# Patient Record
Sex: Female | Born: 1955 | Race: White | Hispanic: No | Marital: Married | Smoking: Former smoker
Health system: Southern US, Community
[De-identification: ages and names within clinical notes are randomized; demographics above are authoritative.]

## PROBLEM LIST (undated history)

## (undated) DIAGNOSIS — T884XXA Failed or difficult intubation, initial encounter: Secondary | ICD-10-CM

## (undated) DIAGNOSIS — N83299 Other ovarian cyst, unspecified side: Secondary | ICD-10-CM

## (undated) DIAGNOSIS — R7303 Prediabetes: Secondary | ICD-10-CM

## (undated) DIAGNOSIS — Z8659 Personal history of other mental and behavioral disorders: Secondary | ICD-10-CM

## (undated) DIAGNOSIS — F411 Generalized anxiety disorder: Secondary | ICD-10-CM

## (undated) DIAGNOSIS — B49 Unspecified mycosis: Secondary | ICD-10-CM

## (undated) DIAGNOSIS — Z789 Other specified health status: Secondary | ICD-10-CM

## (undated) DIAGNOSIS — I341 Nonrheumatic mitral (valve) prolapse: Secondary | ICD-10-CM

## (undated) DIAGNOSIS — D259 Leiomyoma of uterus, unspecified: Secondary | ICD-10-CM

## (undated) DIAGNOSIS — G4733 Obstructive sleep apnea (adult) (pediatric): Secondary | ICD-10-CM

## (undated) DIAGNOSIS — F419 Anxiety disorder, unspecified: Secondary | ICD-10-CM

## (undated) DIAGNOSIS — F329 Major depressive disorder, single episode, unspecified: Secondary | ICD-10-CM

## (undated) DIAGNOSIS — E785 Hyperlipidemia, unspecified: Secondary | ICD-10-CM

## (undated) DIAGNOSIS — I491 Atrial premature depolarization: Secondary | ICD-10-CM

## (undated) DIAGNOSIS — R011 Cardiac murmur, unspecified: Secondary | ICD-10-CM

## (undated) DIAGNOSIS — R002 Palpitations: Secondary | ICD-10-CM

## (undated) DIAGNOSIS — Z973 Presence of spectacles and contact lenses: Secondary | ICD-10-CM

## (undated) DIAGNOSIS — F32A Depression, unspecified: Secondary | ICD-10-CM

## (undated) HISTORY — DX: Depression, unspecified: F32.A

## (undated) HISTORY — DX: Nonrheumatic mitral (valve) prolapse: I34.1

## (undated) HISTORY — DX: Anxiety disorder, unspecified: F41.9

## (undated) HISTORY — DX: Major depressive disorder, single episode, unspecified: F32.9

## (undated) HISTORY — DX: Hyperlipidemia, unspecified: E78.5

---
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
## 2018-03-10 DIAGNOSIS — Z8639 Personal history of other endocrine, nutritional and metabolic disease: Secondary | ICD-10-CM

## 2018-03-10 HISTORY — DX: Personal history of other endocrine, nutritional and metabolic disease: Z86.39

## 2018-04-06 ENCOUNTER — Ambulatory Visit: Payer: BLUE CROSS/BLUE SHIELD | Admitting: Internal Medicine

## 2018-04-06 ENCOUNTER — Encounter: Payer: Self-pay | Admitting: Internal Medicine

## 2018-04-06 VITALS — BP 140/90 | HR 92 | Ht 64.5 in | Wt 125.0 lb

## 2018-04-06 DIAGNOSIS — E0500 Thyrotoxicosis with diffuse goiter without thyrotoxic crisis or storm: Secondary | ICD-10-CM

## 2018-04-06 DIAGNOSIS — R946 Abnormal results of thyroid function studies: Secondary | ICD-10-CM | POA: Diagnosis not present

## 2018-04-06 LAB — T3, FREE: T3 FREE: 3.6 pg/mL (ref 2.3–4.2)

## 2018-04-06 LAB — TSH: TSH: 1.04 u[IU]/mL (ref 0.35–4.50)

## 2018-04-06 LAB — T4, FREE: FREE T4: 1.07 ng/dL (ref 0.60–1.60)

## 2018-04-06 NOTE — Patient Instructions (Addendum)
You have Hashimoto's thyroiditis.  Please start Propranolol ER 60 mg daily at night.  You can start Selenium 200 mcg daily.  Please stop at the lab.  Please look up: Rip Esselstyn - [employer] diet    Please return in 4 months.

## 2018-04-06 NOTE — Progress Notes (Signed)
Patient ID: Robin Reynolds, female   DOB: 1956/06/30, 62 y.o.   MRN: [id number]   HPI  Robin Reynolds is a 62 y.o.-year-old female, referred by her cardiologist, Dr.Ganji, for management of elevated free T4, suspicious for Graves ds. She lives 2h away.  Patient describes that at the time of her annual physical exam in 10/2017, she was found to have an irregular heart rhythm and was recommended to wear a Holter monitor.  This revealed frequent PVCs and 2 runs of atrial tachycardia per review of Dr. Irven Shelling notes.  She also has panic attacks,  palpitations and atypical chest pain.  Per Dr. Einar Gip, the chest pain appears to be musculoskeletal.  She also has MVP.  Investigation revealed a normal TSH, of 1.18, but slightly high free T4, of 1.35 (0.81-1.12). However. At that time, she was on Biotin.  Thyroid scan (02/26/2018) showed: Increased homogeneous radiotracer uptake throughout each normal-sized thyroid lobe.  Markedly increased uptake relative to activity in salivary glands.  This was read as consistent with Graves' disease.  She was started on a low-dose beta-blocker (Inderal ER 60 mg daily at night) on 07/30/2017, since she usually has a low blood pressure.  She saw an endocrinologist 2 weeks ago >> new labs normal except elevated anti-thyroglobulin antibodies.   I reviewed pt's thyroid tests - per records from Dr. Einar Gip as well as brought by pt: 03/22/2018: TSH 0.981, fT4 1.2, fT3 3.1, TSI <89, TPO 5, ATA 2  (<1) 02/12/2018: TSH 1.18, fT4 1.35 (0.81-1.12) 06/08/2017: TSH 1.290, rT3 17.8 (9.2-24.1), fT4 1.25, fT3 2.9, Se 130 (91-198) No results found for: TSH, FREET4, T3FREE  Antithyroid antibodies: 03/22/2018: TSI <89 No results found for: TSI  Pt denies feeling nodules in neck, but has hoarseness, dysphagia, no SOB with lying down.  She c/o: - palpitations - anxiety - unintentional wt loss-  30 lbs over < a year - tremors - irritability - anxiety - insomnia - night sweats -  fatigue - HAs - brain fog - dry skin - thinning hair at temples  Pt does have a FH of thyroid ds.: father (? Problem), nephew (Hashimoto's ds). No FH of thyroid cancer. No h/o radiation tx to head or neck.  No seaweed or kelp, no recent contrast studies. No steroid use. No herbal supplements. + Biotin use - last dose 3 days ago..  ROS: Constitutional: + see HPI Eyes: no blurry vision, no xerophthalmia ENT: no sore throat, + see HOI+ tinnitus Cardiovascular: no CP/SOB/palpitations/leg swelling Respiratory: +cough/+ SOB Gastrointestinal: + N/no V/D/C Musculoskeletal: no muscle/joint aches Skin: no rashes, + hair thinning/loss Neurological: + tremors/no numbness/tingling/dizziness Psychiatric: + both  depression/anxiety  Past Medical History:  Diagnosis Date  . Anxiety   . Depression   . Hyperlipidemia   . Mitral valve prolapse    History reviewed. No pertinent surgical history. Social History   Socioeconomic History  . Marital status: married    Spouse name: Not on file  . Number of children: 0  Occupational History  . retired  Tobacco Use  . Smoking status: Former Research scientist (life sciences)  . Smokeless tobacco: Never Used  Substance and Sexual Activity  . Alcohol use: Yes, wine, rarely  . Drug use: Never   Current Outpatient Medications on File Prior to Visit  Medication Sig Dispense Refill  . Ascorbic Acid (VITAMIN C) 1000 MG tablet Take 1,000 mg by mouth daily.    . Calcium Carbonate-Vitamin D 300-100 MG-UNIT CAPS Take by mouth.    . Omega-3 Fatty Acids (FISH  OIL CONCENTRATE PO) Take by mouth.    . Probiotic Product (PROBIOTIC DAILY PO) Take by mouth.    . Pyridoxine HCl (VITAMIN B6 PO) Take by mouth.    . sertraline (ZOLOFT) 100 MG tablet     . traZODone (DESYREL) 150 MG tablet Take by mouth.    . norethindrone (MICRONOR,CAMILA,ERRIN) 0.35 MG tablet     . propranolol ER (INDERAL LA) 60 MG 24 hr capsule      No current facility-administered medications on file prior to visit.     Allergies  Allergen Reactions  . Ciprofloxacin    Family History  Problem Relation Age of Onset  . Cancer Mother 31       Stomach  . Thyroid disease Father   . Diabetes Neg Hx   . Hypertension Neg Hx     PE: BP 140/90 (BP Location: Left Arm, Patient Position: Sitting, Cuff Size: Normal)   Pulse 92   Ht 5' 4.5" (1.638 m)   Wt 125 lb (56.7 kg)   SpO2 97%   BMI 21.12 kg/m  Wt Readings from Last 3 Encounters:  04/06/18 125 lb (56.7 kg)   Constitutional: Normal weight, in NAD Eyes: PERRLA, EOMI, no exophthalmos, no lid lag, no stare ENT: moist mucous membranes, no thyromegaly, no thyroid bruits, no cervical lymphadenopathy Cardiovascular: tachycardia, RR, No MRG Respiratory: CTA B Gastrointestinal: abdomen soft, NT, ND, BS+ Musculoskeletal: no deformities, strength intact in all 4 Skin: moist, warm, no rashes Neurological: no tremor with outstretched hands, DTR normal in all 4  ASSESSMENT: 1. Elevated free T4  PLAN:  1. Patient with a recently found elevated free T4 with normal TSH, with thyrotoxic sxs: weight loss,  palpitations/tachycardia, anxiety/depression, insomnia, irritability, tremors. Patient describes that the symptoms developed over ~1 year.  At the end of last year, she had investigation for her tachycardia and was found to have multiple PVCs on Holter monitor.  TSH levels have been normal, however, she had one elevated free T4 on the labs checked in 02/2018.  At that time, she was prescribed propranolol but she did not start it yet. - At this visit we reviewed her tests from last summer, the ones from 02/2018, and also most recent labs obtained by another endocrinologist approximately 2 weeks ago.  The thyroid function tests are normal except for one slightly elevated free T4.  She had a thyroid scan that showed diffuse distribution of the tracer, and no evident nodules.  In the absence of an uptake, it is difficult to diagnose this as Graves' disease.  To be able  to diagnose this condition, we would need an elevated uptake (higher than 30%), and a homogeneous scan.  Moreover, her TSI antibodies are not elevated, while her ATA antibodies are slightly elevated.  In this situation, I explained that her most likely diagnosis is Hashimoto's thyroiditis (with probably both inhibiting and stimulating antibodies).  It is of note that she is taking biotin and may have taken this right before her labs from 02/2018 - this is known to suppress the TSH and elevate free T4 and free T3 due to an interference with the assay.  I cannot rule out at this point such etiology of her elevated free T4.  If her symptoms do not improve, I would like to obtain a thyroid uptake and scan in the near future. - However, we discussed at length today about autoimmune thyroid diseases, including Graves' disease and Hashimoto's thyroiditis.  We discussed that we do not have curative  treatments for the latter (short of thyroidectomy), but I suggested several methods for improving her immune system so that her symptoms improve: Rest, good sleep, exercise, meditation (which she already started to do), take time during the day for herself, good diet (we discussed about an anti-inflammatory diet and suggested references). Also, selenium supplementation has been found to help in some cases by decreasing antithyroid antibodies, so I suggested to start 200 mcg daily. - I think anxiety plays a huge role in her symptoms.  She is on Zoloft and I feel starting propranolol will greatly help.  I strongly advised her to do so and to take it at night since it can promote sleep. - Today we will check her TFTs again (last biotin dose 2 to 3 days ago) - I advised her to join my chart to communicate easier - RTC in 4 months, but likely sooner for repeat labs  Component     Latest Ref Rng & Units 04/06/2018  T4,Free(Direct)     0.60 - 1.60 ng/dL 1.07  Triiodothyronine,Free,Serum     2.3 - 4.2 pg/mL 3.6  TSH     0.35  - 4.50 uIU/mL 1.04   TFTs normal.   We will continue with the plan above and I will recheck her TFTs when she comes back in 4 months.  Philemon Kingdom, MD PhD [facility] Endocrinology

## 2018-04-08 ENCOUNTER — Encounter: Payer: Self-pay | Admitting: Internal Medicine

## 2018-04-20 ENCOUNTER — Other Ambulatory Visit: Payer: Self-pay | Admitting: Internal Medicine

## 2018-04-20 ENCOUNTER — Encounter: Payer: Self-pay | Admitting: Internal Medicine

## 2018-04-20 DIAGNOSIS — R635 Abnormal weight gain: Secondary | ICD-10-CM

## 2018-04-30 ENCOUNTER — Encounter: Payer: Self-pay | Admitting: Internal Medicine

## 2018-05-20 ENCOUNTER — Ambulatory Visit: Payer: BLUE CROSS/BLUE SHIELD | Admitting: Dietician

## 2018-05-21 LAB — CBC AND DIFFERENTIAL
HEMATOCRIT: 42 (ref 36–46)
Hemoglobin: 14.4 (ref 12.0–16.0)
PLATELETS: 249 (ref 150–399)
WBC: 8.5

## 2018-05-21 LAB — HEPATIC FUNCTION PANEL
ALT: 17 (ref 7–35)
AST: 16 (ref 13–35)

## 2018-05-21 LAB — VITAMIN D 25 HYDROXY (VIT D DEFICIENCY, FRACTURES): VIT D 25 HYDROXY: 38.9

## 2018-05-21 LAB — HEMOGLOBIN A1C: HEMOGLOBIN A1C: 5.5

## 2018-05-21 LAB — VITAMIN B12: Vitamin B-12: 508

## 2018-08-05 ENCOUNTER — Encounter: Payer: Self-pay | Admitting: Internal Medicine

## 2018-08-05 ENCOUNTER — Ambulatory Visit: Payer: BLUE CROSS/BLUE SHIELD | Admitting: Internal Medicine

## 2018-08-05 VITALS — BP 138/80 | HR 109 | Ht 64.5 in | Wt 118.0 lb

## 2018-08-05 DIAGNOSIS — R946 Abnormal results of thyroid function studies: Secondary | ICD-10-CM

## 2018-08-05 NOTE — Patient Instructions (Signed)
Please return to see me as needed. °

## 2018-08-05 NOTE — Progress Notes (Signed)
Patient ID: Robin Reynolds, female   DOB: 1956-01-25, 62 y.o.   MRN: [id number]   HPI  Robin Reynolds is a 63 y.o.-year-old female, initially referred by her cardiologist, Dr.Ganji, for management of elevated free T4, suspicious for Graves ds.  She lives 2 [place] away.  Last visit 4 months ago.  She has increased anxiety >> will see Dr. Edilia Bo soon. She increased her Zoloft - no relief. She changed to Cymbalta.  She recently started to see an integrative medicine provider who diagnosed her with intestinal candidiasis.  She is undergoing treatment now.  Reviewed and addended history: Patient describes that at the time of her annual physical exam in 10/2017, she was found to have an irregular heart rhythm and was recommended to wear a Holter monitor.  This revealed frequent PVCs and 2 runs of atrial tachycardia per review of Dr. Irven Shelling notes.  She also has panic attacks,  palpitations and atypical chest pain.  Per Dr. Einar Gip, the chest pain appears to be musculoskeletal.  She also has MVP.  Investigation revealed a normal TSH, of 1.18, but slightly high free T4, of 1.35 (0.81-1.12).  However, at that time, she thinks she may have been on biotin.  She saw an endocrinologist before our last visit.  New labs showed elevated thyroid antibodies.  Reviewed her previous: Thyroid scan (02/26/2018) : Increased homogeneous radiotracer uptake throughout each normal-sized thyroid lobe.  Markedly increased uptake relative to activity in salivary glands.  This was read as consistent with Graves' disease.  I reviewed patient's TFTs, per records from Dr. Einar Gip and from last visit: 06/28/2018: TSH 1.40, fT4 0.95 Lab Results  Component Value Date   TSH 1.04 04/06/2018   FREET4 1.07 04/06/2018   T3FREE 3.6 04/06/2018  03/22/2018: TSH 0.981, fT4 1.2, fT3 3.1, TSI <89, TPO 5, ATA 2  (<1) 02/12/2018: TSH 1.18, fT4 1.35 (0.81-1.12) 06/08/2017: TSH 1.290, rT3 17.8 (9.2-24.1), fT4 1.25, fT3 2.9, Se 130  (91-198)  Antithyroid antibodies: 03/22/2018: TSI <89 No results found for: TSI  Pt denies: - feeling nodules in neck - dysphagia - choking - SOB with lying down But does have hoarseness  She still complains of: - anxiety associated with chest pain - Brain fog - fatigue - unintentional wt loss-  30 lbs over < a year, 7 pounds since last visit - hair loss  Pt does have a FH of thyroid ds.: father (? Problem), nephew (Hashimoto's ds). No FH of thyroid cancer. No h/o radiation tx to head or neck.  No seaweed or kelp. No recent contrast studies. No herbal supplements. No Biotin use. No recent steroids use.   She started Prometrium >> sleeps better.   ROS: Constitutional: + See HPI Eyes: no blurry vision, no xerophthalmia ENT: no sore throat, + see HPI Cardiovascular: no CP/no SOB/+ palpitations/no leg swelling Respiratory: no cough/no SOB/no wheezing Gastrointestinal: no N/no V/no D/no C/no acid reflux Musculoskeletal: no muscle aches/no joint aches Skin: no rashes, no hair loss Neurological: no tremors/no numbness/no tingling/no dizziness  I reviewed pt's medications, allergies, PMH, social hx, family hx, and changes were documented in the history of present illness. Otherwise, unchanged from my initial visit note.  Past Medical History:  Diagnosis Date  . Anxiety   . Depression   . Hyperlipidemia   . Mitral valve prolapse    No past surgical history on file. Social History   Socioeconomic History  . Marital status: married    Spouse name: Not on file  . Number of children:  0  Occupational History  . retired  Tobacco Use  . Smoking status: Former Research scientist (life sciences)  . Smokeless tobacco: Never Used  Substance and Sexual Activity  . Alcohol use: Yes, wine, rarely  . Drug use: Never   Current Outpatient Medications on File Prior to Visit  Medication Sig Dispense Refill  . Ascorbic Acid (VITAMIN C) 1000 MG tablet Take 1,000 mg by mouth daily.    . Calcium  Carbonate-Vitamin D 300-100 MG-UNIT CAPS Take by mouth.    . Omega-3 Fatty Acids (FISH OIL CONCENTRATE PO) Take by mouth.    . Probiotic Product (PROBIOTIC DAILY PO) Take by mouth.    . propranolol ER (INDERAL LA) 60 MG 24 hr capsule     . Pyridoxine HCl (VITAMIN B6 PO) Take by mouth.    . sertraline (ZOLOFT) 100 MG tablet     . traZODone (DESYREL) 150 MG tablet Take by mouth.     No current facility-administered medications on file prior to visit.    Allergies  Allergen Reactions  . Ciprofloxacin    Family History  Problem Relation Age of Onset  . Cancer Mother 8       Stomach  . Thyroid disease Father   . Diabetes Neg Hx   . Hypertension Neg Hx     PE: BP 138/80   Pulse (!) 109   Ht 5' 4.5" (1.638 m) Comment: measured  Wt 118 lb (53.5 kg)   SpO2 98%   BMI 19.94 kg/m  Wt Readings from Last 3 Encounters:  08/05/18 118 lb (53.5 kg)  04/06/18 125 lb (56.7 kg)   Constitutional: Normal weight, in NAD Eyes: PERRLA, EOMI, no exophthalmos ENT: moist mucous membranes, no thyromegaly, no cervical lymphadenopathy Cardiovascular: Tachycardia, RR, No MRG Respiratory: CTA B Gastrointestinal: abdomen soft, NT, ND, BS+ Musculoskeletal: no deformities, strength intact in all 4 Skin: moist, warm, no rashes Neurological: no tremor with outstretched hands, DTR normal in all 4  ASSESSMENT: 1. Elevated free T4  PLAN:  1. Patient history of an elevated free T4, with normal TSH but with thyrotoxic symptoms: Weight loss, palpitations/tachycardia, anxiety/depression, insomnia, irritability, tremors.  The symptoms developed over approximately 1 year.  At the end of last year, she had investigation for her tachycardia and was found to have multiple PVCs on Holter monitor.  TSH levels have been normal, however, she had one elevated free T4 on the labs checked in 02/2018.  Of note, she may have taken biotin prior to the labs from 02/2018 and this is known to suppressed TSH and elevated free T4  and free T3 due to interference with the assay.  Since then, she is only taking biotin and a multivitamin.  She brought this today with her and this contains 400 mcg of biotin.  -Of note, her TSI antibodies were not elevated to point towards Graves' disease.  Her ATA antibodies were slightly elevated.  She also had a thyroid scan that showed diffuse distribution of the tracer and no evident nodules.  An uptake was not performed at that time, so a diagnosis of Graves' cannot be made... -At last visit, we checked her TFTs and they were all normal.  At that time, we discussed that she most likely has Hashimoto's thyroiditis (with probably both inhibiting and stimulating antibodies).  Since then, she had another set of labs performed by PCP and these were all normal (see HPI) -At last visit, we discussed at length about autoimmune thyroid diseases, including Graves' disease and Hashimoto's thyroiditis.  We discussed about methods to improve her immune system.  She is now on a diet for her intestinal candidiasis.  She is upset that she lost more weight and we discussed about including more calorie dense foods in her diet. -She continues to have anxiety, however, with normal thyroid tests, we cannot attribute this to the thyroid.   -I will see her back on an as-needed basis.  I did recommend that she has thyroid tests: TSH, free T4, free T3 checked once a year from now on and return to see me if this become abnormal.  - time spent with the patient: 25 min, of which >50% was spent in obtaining information about her symptoms, reviewing her previous labs, evaluations, and treatments, counseling her about her condition (please see the discussed topics above), and developing a plan to further investigate and treat it; she had a number of questions which I addressed.   Philemon Kingdom, MD PhD [facility] Endocrinology
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
## 2018-08-16 ENCOUNTER — Ambulatory Visit: Payer: BLUE CROSS/BLUE SHIELD | Admitting: Family Medicine

## 2018-08-16 ENCOUNTER — Encounter: Payer: Self-pay | Admitting: Family Medicine

## 2018-08-16 ENCOUNTER — Encounter

## 2018-08-16 VITALS — BP 130/80 | HR 82 | Temp 97.8°F | Resp 16 | Ht 64.5 in | Wt 119.0 lb

## 2018-08-16 DIAGNOSIS — R002 Palpitations: Secondary | ICD-10-CM

## 2018-08-16 DIAGNOSIS — F418 Other specified anxiety disorders: Secondary | ICD-10-CM | POA: Diagnosis not present

## 2018-08-16 DIAGNOSIS — I499 Cardiac arrhythmia, unspecified: Secondary | ICD-10-CM

## 2018-08-16 DIAGNOSIS — F339 Major depressive disorder, recurrent, unspecified: Secondary | ICD-10-CM

## 2018-08-16 DIAGNOSIS — R634 Abnormal weight loss: Secondary | ICD-10-CM | POA: Diagnosis not present

## 2018-08-16 LAB — COMPREHENSIVE METABOLIC PANEL
ALT: 13 U/L (ref 0–35)
AST: 15 U/L (ref 0–37)
Albumin: 4.3 g/dL (ref 3.5–5.2)
Alkaline Phosphatase: 70 U/L (ref 39–117)
BUN: 17 mg/dL (ref 6–23)
CHLORIDE: 103 meq/L (ref 96–112)
CO2: 29 meq/L (ref 19–32)
Calcium: 9.1 mg/dL (ref 8.4–10.5)
Creatinine, Ser: 0.91 mg/dL (ref 0.40–1.20)
GFR: 66.29 mL/min (ref >60.00)
Glucose, Bld: 82 mg/dL (ref 70–99)
POTASSIUM: 3.6 meq/L (ref 3.5–5.1)
Sodium: 138 mEq/L (ref 135–145)
Total Bilirubin: 0.6 mg/dL (ref 0.2–1.2)
Total Protein: 6.6 g/dL (ref 6.0–8.3)

## 2018-08-16 MED ORDER — PROPRANOLOL HCL 10 MG PO TABS: ORAL_TABLET | ORAL | 2 refills | Status: DC

## 2018-08-16 NOTE — Patient Instructions (Signed)
It was nice to meet you today We are going to try and find anything that is causing you to lose weight Labs today We are ordering a Ct scan of your chest, abdomen and pelvis You will see GI and likely have a colonoscopy soon If the increased cymbalta is not helping you we might change to paxil- keep me posted  [facility] may be a good fit for you- please give them a call

## 2018-08-17 ENCOUNTER — Encounter: Payer: Self-pay | Admitting: Family Medicine

## 2018-08-20 ENCOUNTER — Encounter: Payer: Self-pay | Admitting: Family Medicine

## 2018-08-20 DIAGNOSIS — R634 Abnormal weight loss: Secondary | ICD-10-CM

## 2018-08-24 ENCOUNTER — Encounter: Payer: Self-pay | Admitting: Family Medicine

## 2018-08-24 LAB — FOLATE: FOLATE: 12.5

## 2018-08-25 ENCOUNTER — Ambulatory Visit (HOSPITAL_BASED_OUTPATIENT_CLINIC_OR_DEPARTMENT_OTHER)
Admission: RE | Admit: 2018-08-25 | Discharge: 2018-08-25 | Disposition: A | Discharge status: Home / Self Care | Payer: BLUE CROSS/BLUE SHIELD | Source: Ambulatory Visit | Attending: Family Medicine | Admitting: Family Medicine

## 2018-08-25 DIAGNOSIS — R634 Abnormal weight loss: Secondary | ICD-10-CM

## 2018-08-26 ENCOUNTER — Telehealth: Payer: Self-pay | Admitting: *Deleted

## 2018-08-26 NOTE — Telephone Encounter (Signed)
Received Medical records from [facility]; forwarded to provider/SLS 10/17

## 2018-08-31 ENCOUNTER — Encounter: Payer: Self-pay | Admitting: Family Medicine

## 2018-08-31 ENCOUNTER — Telehealth: Payer: Self-pay | Admitting: *Deleted

## 2018-08-31 NOTE — Telephone Encounter (Signed)
Received Denied Imaging request for patient, per [facility] [P[facility]], provider is aware. Peer to peer is needed post required Xray that has been completed; forwarded to provider/SLS 10/22

## 2018-09-06 ENCOUNTER — Encounter (HOSPITAL_BASED_OUTPATIENT_CLINIC_OR_DEPARTMENT_OTHER): Payer: Self-pay

## 2018-09-06 ENCOUNTER — Ambulatory Visit (HOSPITAL_BASED_OUTPATIENT_CLINIC_OR_DEPARTMENT_OTHER)
Admission: RE | Admit: 2018-09-06 | Discharge: 2018-09-06 | Disposition: A | Discharge status: Home / Self Care | Payer: BLUE CROSS/BLUE SHIELD | Source: Ambulatory Visit | Attending: Family Medicine | Admitting: Family Medicine

## 2018-09-06 DIAGNOSIS — D259 Leiomyoma of uterus, unspecified: Secondary | ICD-10-CM | POA: Diagnosis not present

## 2018-09-06 DIAGNOSIS — R634 Abnormal weight loss: Secondary | ICD-10-CM | POA: Diagnosis present

## 2018-09-06 DIAGNOSIS — K5790 Diverticulosis of intestine, part unspecified, without perforation or abscess without bleeding: Secondary | ICD-10-CM | POA: Insufficient documentation

## 2018-09-06 DIAGNOSIS — I2510 Atherosclerotic heart disease of native coronary artery without angina pectoris: Secondary | ICD-10-CM | POA: Insufficient documentation

## 2018-09-06 MED ORDER — IOPAMIDOL (ISOVUE-300) INJECTION 61%
100.0000 mL | Freq: Once | INTRAVENOUS | Status: AC | PRN
  Administered 2018-09-06: 100 mL via INTRAVENOUS

## 2018-09-07 ENCOUNTER — Encounter: Payer: Self-pay | Admitting: Family Medicine

## 2018-09-07 DIAGNOSIS — N83209 Unspecified ovarian cyst, unspecified side: Secondary | ICD-10-CM

## 2018-09-21 NOTE — Progress Notes (Signed)
[facility] at [facility] [address], [place], [place] [address] [phone]  Date:  09/23/2018   Name:  Robin Reynolds   DOB:  03-06-1956   MRN:  [id number]  PCP:  Darreld Mclean, MD    Chief Complaint: Anxiety (cymbalta follow up, would like to possibly increase, feeling a little better-but still having trouble functioning) and Neck Stiffness (feels stiff, feels like it is on "steroids", back pain)   History of Present Illness:  Robin Reynolds is a 62 y.o. very pleasant female patient who presents with the following:  Here today to discuss anxiety History of depression and anxiety Also history of abnormal thyroid test but most recently ok  Last seen by myself about a month ago as a new patient.  See very lengthly note dated 08/16/18 She has been concerned about weight loss so we had touched on the idea of changing her cymbalta over the paxil in hopes of helping her gain weight  NCCSR:  07/08/2018  1   05/21/2018  Alprazolam 0.5 Mg Tablet  90.00 30 Ro Laz  [id number]  Woo ([id number])  1/1 3.00 LME Comm Ins  [facility]  05/21/2018  1   05/21/2018  Alprazolam 0.5 Mg Tablet  90.00 30 Ro Laz  [id number]  Woo ([id number])  0/1 3.00 [place] Comm Ins  [facility]  05/07/2018  1   05/07/2018  Alprazolam 0.25 Mg Tablet  30.00 10 Ro Laz  [id number]  Woo ([id number])  0/0 1.50 LME Comm Ins  [facility]  02/11/2018  1   02/11/2018  Lorazepam 0.5 Mg Tablet  60.00 20 Ro Laz  [id number]  [facility]oo ([id number])  0/5 1.50 [place]ME Comm Ins  [facility]  02/08/2018  1   01/08/2018  Lorazepam 0.5 Mg Tablet  30.00 30 Ro Laz  [id number]  [facility]oo ([id number])  1/1 0.50 LME Private Pay  [facility]  01/08/2018  1   01/08/2018  Lorazepam 0.5 Mg Tablet  30.00 30 Ro Laz  [id number]  Carlena Bjornstad [phone])  0/1      She is now taking 60mg  of cymbalta- once a day She does feel like this is helping her.  She would like to try going up to 90 mg which is fine We tried increasing her propranolol but it "made me depressed within a couple of hours of taking it, I sat and sobbdfor  hours, and it increased my heart rate" She stopped taking propranolol which is fine  She also wonders about "bioidential hormones" that she is getting from her integrative doctor  Advised her that decision about using this is up to her and her other provoider They have her taking progesterone and it does seem to be helping her with sleep  She notes that this am she is feeling quite well, but this is not typical for her She does have some anxiety still which centers around her health Admits that she spends a lot of time doing online reading and research about her health which may cause her to feel more anxious   She did see GI- they want to do a colonoscopy and endoscopy  They want to test her for celiac during her procedure which seems like a good idea  She has tried cutting gluten for the last 2-3 weeks and feels that she is doing better   She did get a psychiatrist and has an appt scheduled for December  She also notes that her neck "feels thick like i've been taking  steroids" and that her belly button has turned from an "innie to an outie"  BP Readings from Last 3 Encounters:  09/23/18 118/78  08/16/18 130/80  08/05/18 138/80   Wt Readings from Last 3 Encounters:  09/23/18 125 lb (56.7 kg)  08/16/18 119 lb (54 kg)  08/05/18 118 lb (53.5 kg)    Lab Results  Component Value Date   TSH 1.04 04/06/2018     Patient Active Problem List   Diagnosis Date Noted  . Abnormal thyroid function test 04/06/2018    Past Medical History:  Diagnosis Date  . Anxiety   . Depression   . Hyperlipidemia   . Mitral valve prolapse     History reviewed. No pertinent surgical history.  Social History   Tobacco Use  . Smoking status: Former Research scientist (life sciences)  . Smokeless tobacco: Never Used  Substance Use Topics  . Alcohol use: Not Currently  . Drug use: Never    Family History  Problem Relation Age of Onset  . Cancer Mother 16       Stomach  . Thyroid disease Father   . Diabetes Neg Hx    . Hypertension Neg Hx     Allergies  Allergen Reactions  . Inderal [Propranolol]     palpitations  . Tussionex Pennkinetic Er [Hydrocod Polst-Cpm Polst Er]   . Ciprofloxacin     Medication list has been reviewed and updated.  Current Outpatient Medications on File Prior to Visit  Medication Sig Dispense Refill  . ALPRAZolam (XANAX) 0.5 MG tablet Take by mouth.    . Calcium Carbonate-Vitamin D 300-100 MG-UNIT CAPS Take by mouth.    . Omega-3 Fatty Acids (FISH OIL CONCENTRATE PO) Take by mouth.    . Probiotic Product (PROBIOTIC DAILY PO) Take by mouth.    . Pyridoxine HCl (VITAMIN B6 PO) Take by mouth.    . traZODone (DESYREL) 150 MG tablet Take by mouth.    Marland Kitchen UNABLE TO FIND Med Name: Bio Identical Progesterone Hormone for sleep     No current facility-administered medications on file prior to visit.     Review of Systems:  As per HPI- otherwise negative. No fever or chills No SI  Continues to have significant anxiety She has gained a few lbs!  Good news  Physical Examination: Vitals:   09/23/18 1058  BP: 118/78  Pulse: 63  Resp: 16  SpO2: 100%   Vitals:   09/23/18 1058  Weight: 125 lb (56.7 kg)  Height: 5' 4.5" (1.638 m)   Body mass index is 21.12 kg/m. Ideal Body Weight: Weight in (lb) to have BMI = 25: 147.6  GEN: WDWN, NAD, Non-toxic, A & O x 3, looks well, slim build  HEENT: Atraumatic, Normocephalic. Neck supple. No masses, No LAD.  Bilateral TM wnl, oropharynx normal.  PEERL,EOMI.   Neck exam is quite normal  Ears and Nose: No external deformity. CV: RRR, No M/G/R. No JVD. No thrill. No extra heart sounds. PULM: CTA B, no wheezes, crackles, rhonchi. No retractions. No resp. distress. No accessory muscle use. ABD: S, NT, ND. No rebound. No HSM. EXTR: No c/c/e NEURO Normal gait.  PSYCH: Normally interactive. Conversant. Not depressed or anxious appearing.  Calm demeanor.  She seems to have developed a skin tag in her umbilicus. Reassured that this  does not seem to be anything of concern   Assessment and Plan: Depression, recurrent ([place]) - Plan: DULoxetine (CYMBALTA) 30 MG capsule  Abnormal weight loss  Anxiety about health  She  wishes to find a GYN doctor and I have provided a referral for her We are going to try increasing her cymbalta to 90mg  per day She is seeing psychiatry soon which is great Declines a flu shot today Plan to visit in 2-3 months   Signed Lamar Blinks, MD

## 2018-09-23 ENCOUNTER — Telehealth: Payer: Self-pay

## 2018-09-23 ENCOUNTER — Ambulatory Visit: Payer: BLUE CROSS/BLUE SHIELD | Admitting: Family Medicine

## 2018-09-23 ENCOUNTER — Encounter: Payer: Self-pay | Admitting: Family Medicine

## 2018-09-23 VITALS — BP 118/78 | HR 63 | Resp 16 | Ht 64.5 in | Wt 125.0 lb

## 2018-09-23 DIAGNOSIS — F339 Major depressive disorder, recurrent, unspecified: Secondary | ICD-10-CM

## 2018-09-23 DIAGNOSIS — R634 Abnormal weight loss: Secondary | ICD-10-CM

## 2018-09-23 DIAGNOSIS — F418 Other specified anxiety disorders: Secondary | ICD-10-CM

## 2018-09-23 MED ORDER — DULOXETINE HCL 30 MG PO CPEP: 90.0000 mg | ORAL_CAPSULE | Freq: Every day | ORAL | 3 refills | Status: DC

## 2018-09-23 NOTE — Telephone Encounter (Signed)
PA initiated via Covermymeds; KEY: [id number]. Awaiting determination.

## 2018-09-23 NOTE — Patient Instructions (Addendum)
[facility]   Let's try increasing your cymbalta to 90 mg total a day- perhaps 30 am and 60pm, or vice versa Let me know what your colonoscopy shows!   Let's visit in 2-3 months to check on how you are doing

## 2018-09-23 NOTE — Telephone Encounter (Signed)
PA approved. Effective 09/23/2018 to 09/23/2019.

## 2018-10-04 ENCOUNTER — Encounter: Payer: Self-pay | Admitting: Family Medicine

## 2018-10-04 DIAGNOSIS — N83209 Unspecified ovarian cyst, unspecified side: Secondary | ICD-10-CM

## 2018-10-10 HISTORY — PX: COLONOSCOPY WITH PROPOFOL: SHX5780

## 2018-10-12 ENCOUNTER — Encounter: Payer: Self-pay | Admitting: Family Medicine

## 2018-10-12 ENCOUNTER — Telehealth: Payer: Self-pay

## 2018-10-12 NOTE — Telephone Encounter (Signed)
Copied from [place] [id number]. Topic: General - Other >> Oct 12, 2018 10:42 AM Yvette Rack wrote: Reason for CRM: pt calling sttaing that she has her appt with [facility], DR. Fogleman on Jan 2nd but they don't have her CT scans that was done on 09-07-18 pt and [facility] would like for provider to change the purpose of referral to cyst and anxiety the referral has neck and shoulder pain which doesn't go to a OBGYN office pt would like a call back when its done at [phone]

## 2018-10-12 NOTE — Telephone Encounter (Signed)
Spoke w/ Pt- informed of below. Pt verbalized understanding.  

## 2018-10-12 NOTE — Telephone Encounter (Signed)
I have called [facility] The reason for the referral is already "ovarian cyst" so all ok here.  The MD is able to pull up and view her CT scans done in October.  All is ready per my understanding Please call her and let her know Thank you! [place]

## 2018-10-14 ENCOUNTER — Ambulatory Visit: Payer: BLUE CROSS/BLUE SHIELD | Admitting: Dietician

## 2018-10-18 ENCOUNTER — Encounter: Payer: Self-pay | Admitting: Internal Medicine

## 2018-10-25 ENCOUNTER — Encounter: Payer: Self-pay | Admitting: Dietician

## 2018-10-25 ENCOUNTER — Encounter: Payer: BLUE CROSS/BLUE SHIELD | Attending: Internal Medicine | Admitting: Dietician

## 2018-10-25 DIAGNOSIS — Z6820 Body mass index (BMI) 20.0-20.9, adult: Secondary | ICD-10-CM | POA: Insufficient documentation

## 2018-10-25 DIAGNOSIS — R634 Abnormal weight loss: Secondary | ICD-10-CM | POA: Insufficient documentation

## 2018-10-25 DIAGNOSIS — Z91018 Allergy to other foods: Secondary | ICD-10-CM

## 2018-10-25 DIAGNOSIS — Z713 Dietary counseling and surveillance: Secondary | ICD-10-CM | POA: Diagnosis present

## 2018-10-25 DIAGNOSIS — E569 Vitamin deficiency, unspecified: Secondary | ICD-10-CM

## 2018-10-25 NOTE — Patient Instructions (Signed)
Breakfast, Lunch, Dinner daily Avoid skipping meals Snack if you are hungry Protein shake for a snack if you wish Be careful not to over restrict your diet. Increasing your intake of vegetables and plant foods is very healthy.

## 2018-10-25 NOTE — Progress Notes (Signed)
Medical Nutrition Therapy:  Appt start time: 7858 end time:  8502.   Assessment:  Primary concerns today: .  Patient is here today alone as she is concerned about her weight loss.  She does not know why she is losing weight.  She states that she is having increased depression and anxiety.  She would like to regain to 130 lbs.  History includes MVP and prediabetes in the past but resolved with weight loss.  Weight hx: 122 lbs today (BMI 20) 118 lbs September 2019 Felt that she started to gain weight when she is not eating gluten. 153 lbs highest adult weight. 118 lbs lowest adult weight Weight loss started about 15 months ago.  She started on a plant based diet for a couple of weeks and lost a small amount of weight. She lost weight rather rapidly.  She lost increased weight, muscle, and became weak.  She developed increased anxiety and depression.  Medication is not helping this.  She admitted herself to a psychiatric hospital for 2 days and was worse after this visit.  She then started to feel better and gained weight a little.  Then 1 year ago she had some triggers (husband who is in good health was preplanning his funeral).  She continued to lose weight.  Lost appetite and had to "choke food down" for 2-3 months.   Started Cymbalta in June/July 2019 and appetite improved somewhat. She saw Dr. Cruzita Lederer and was suspicious for Hashimoto's Disease.  She has been tested for Celiac and this was negative but was only on Gluten for 1 week prior to the test.  She also is being treated for a systemic mold infection and candida infection.  She would like more testing related to leaky gut./SIBO.  She had the IgG test done twice (1 1/2 years ago and 2 weeks ago).  Few mildly elevated scores and improved in the past 1 1/2 years.  Dairy and almonds remain the highest.  No reaction to gluten but reports feeling best without this. She has had several vitamin tests done.  Cholesterol 239, HDL 48, LDL 165,  Triglycerides 125,  Vitamin D 27 12/18/16 now on vitamin D supplement. 05/26/18:  Vitamin B-6 was low at 2.8, vitamin C 6.6Currently she has no desire to eat but is now able to eat well.  Her weight has increased 4 lbs in the past 3 months. Today, depression screening score was 21.  She is seeing a psychiatrist for the first time this week.  She drinks 1-2 glasses of wine many nights.  She is currently taking a break from the xanax to avoid addiction.   Patient lives with her husband.  She is a retired Customer service manager.  She has little social support and lives in [place] area.  She was raised Manufacturing engineer and has increased anxiety about taking too many medications. She is from [place].  Preferred Learning Style:   No preference indicated   Learning Readiness:   Ready  Change in progress   MEDICATIONS: see list to include vitamin D, vitamin B-complex   DIETARY INTAKE: Little sugar, dairy, and gluten. 24-hr recall:  B ( AM): gluten free oatmeal, butter (unsweetened) OR Gluten free toast, butter, egg, 2-3 strips of bacon Snk ( AM):   L ( PM): smoothie (fruit, spinach or kale, Ensure) in the past but now leftovers Snk ( PM): cheesy popcorn D ( PM): beef and vegetable stir fry over white rice OR GF PB sandwich OR meat, chicken, or  shellfish, sweet potato or rice, vegetable Snk ( PM):  Beverages: black coffee, water, stevia gingerale, 1-2 glasses of wine daily  Usual physical activity: none  Estimated energy needs: 1800-1900 calories 60 g protein  Progress Towards Goal(s):  In progress.   Nutritional Diagnosis:  NB-1.1 Food and nutrition-related knowledge deficit As related to allergies, intolerances as well as nutrition for weight maintencance and gain.  As evidenced by diet hx and patient report.    Intervention:  Nutrition counseling/education initiated.  Discussed her medical history, allergy panel (concern of false positive and false negative), restrictive nature of her diet and  her anxiety/depression effecting her nutrition intake.  Encouraged her to consume 3 meals per day and snack if she is hungry.  Use the smoothie as a snack instead of a meal replacement.  Discussed benefits of increased fruits and vegetables.  Patient is not interested in a fully plant based diet at this time but is willing to increase these foods as able.  Discussed importance of caring for herself.  Breakfast, Lunch, Dinner daily Avoid skipping meals Snack if you are hungry Protein shake for a snack if you wish Be careful not to over restrict your diet. Increasing your intake of vegetables and plant foods is very healthy.  Teaching Method Utilized:  Visual Auditory  Handouts given during visit include:  Snack list  Barriers to learning/adherence to lifestyle change: depression/anxiety  Demonstrated degree of understanding via:  Teach Back   Monitoring/Evaluation:  Dietary intake, exercise, and body weight in 1 month(s).

## 2018-11-21 NOTE — Progress Notes (Addendum)
[facility] [place], [place], [place] [id number] [phone]  Date:  11/24/2018   Name:  Robin Reynolds   DOB:  12/08/1955   MRN:  [id number]  PCP:  Darreld Mclean, MD    Chief Complaint: Depression   History of Present Illness:  Robin Reynolds is a 63 y.o. very pleasant female patient who presents with the following: See note from 10/19 for more information about this patient-  She came to see me originally due to abnormal weight loss of about 30 lbs over approximately the last year Most recently seen by myself in November at which time we increased her cymbalta to 90 mg  She was planning to see a psychiatrist- Dr.Kohn in [place]-  last month - she did end up being seen and is undergoing [facility] therapy daily right now.  They plan to do this for about 9 weeks.  She has been doing this for 10 days now.  They don't expect it to be helping her yet but hope she will see an effect by week 2 or 3 They did also  bump her cymbalta to 120 mg She has noted abnormal weight loss of about 30 lbs over approximately the last year She was also suffering from significant psychological stressors.  She had become estranged from her strep daughter and 2 grandchildren, which is been really hard.  We did CT scans of her chest/ abd/ pelvis due to weight loss- all ok except for a left ovarian dermoid which is being evaluated by GYN.  Her GYN- Dr. Pamala Hurry- is going to do an [place] for her today to follow-up, she did not seem "too worried" at this time Dr. Pamala Hurry said that progesterone is ok, and they are still talking about her containing to use estrogen  Referred her to [facility] for further eval of weight loss - they did an upper and lower GI scope  She had a SIBO (small intestinal bacterial overgrowth) test at [facility]; it was negative They are going to do a repeat upper GI next week.  They plan to repeat a colonoscopy in 3 years   She has stopped taking  propranolol  She also saw a nutritionist in December Her cardiologist is Dr. Ronnell Freshwater Readings from Last 3 Encounters:  11/24/18 126 lb (57.2 kg)  10/25/18 122 lb (55.3 kg)  09/23/18 125 lb (56.7 kg)   Flu: give today  Hep C screening: do today  Tetanus vaccine: she will look into the date of her last shot and let me know if she is due has not had a chance to got it last night and this morning epic was not working this morning stating it has been  Robin Reynolds tends to have some alternative health believes, and has been getting some labs done on her own.  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 Again, I have advised Robin Reynolds that most persons with a competent immune system (who do not have AIDS, not on chemotherapy) will not develop a systemic mold infection  However, she is certainly free to do as she sees fit.  She is going to see another type of integrative doctor up in [place], and also plans to journey to [facility] to see another mold specialist.  She self ordered some labs due  to concern of her fingers going numb.  Her B6 was high so she stopped taking her B supplement and her neuropathy is now resolved.  She is waiting on a B2 that she had done   She feels like she is "really weak" Her neurologist is in [place] with [facility] She reports that a brain MRI was done yesterday, and it was normal   Lab Results  Component Value Date   TSH 1.04 04/06/2018    Patient Active Problem List   Diagnosis Date Noted  . Abnormal thyroid function test 04/06/2018    Past Medical History:  Diagnosis Date  . Anxiety   . Depression   . Hyperlipidemia   . Mitral valve prolapse     History reviewed. No pertinent surgical history.  Social History   Tobacco Use  . Smoking status: Former Research scientist (life sciences)  . Smokeless tobacco: Never Used  Substance Use Topics  .  Alcohol use: Not Currently  . Drug use: Never    Family History  Problem Relation Age of Onset  . Cancer Mother 3       Stomach  . Thyroid disease Father   . Diabetes Neg Hx   . Hypertension Neg Hx     Allergies  Allergen Reactions  . Inderal [Propranolol]     palpitations  . Tussionex Pennkinetic Er [Hydrocod Polst-Cpm Polst Er]   . Ciprofloxacin     Medication list has been reviewed and updated.  Current Outpatient Medications on File Prior to Visit  Medication Sig Dispense Refill  . ALPRAZolam (XANAX) 0.5 MG tablet Take by mouth.    . Calcium Carbonate-Vitamin D 300-100 MG-UNIT CAPS Take by mouth.    . cholecalciferol (VITAMIN D3) 25 MCG (1000 UT) tablet Take 5,000 Units by mouth daily.    . Cyanocobalamin (B-12 PO) Take 1 tablet by mouth daily.    . DULoxetine (CYMBALTA) 60 MG capsule Take 2 capsules by mouth daily.    . Omega-3 Fatty Acids (FISH OIL CONCENTRATE PO) Take by mouth.    . Probiotic Product (PROBIOTIC DAILY PO) Take by mouth.    . traZODone (DESYREL) 150 MG tablet Take by mouth.    Marland Kitchen UNABLE TO FIND Take 100 mg by mouth daily. Med Name: Bio Identical Progesterone Hormone for sleep      No current facility-administered medications on file prior to visit.     Review of Systems:  As per HPI- otherwise negative. No fever or chills, no chest pain or shortness of breath.  She has gained back a few pounds finally, which is good news  Physical Examination: Vitals:   11/24/18 1053  BP: 116/80  Pulse: 94  Resp: 16  Temp: 98.3 F (36.8 C)  SpO2: 98%   Vitals:   11/24/18 1053  Weight: 126 lb (57.2 kg)  Height: 5' 4.5" (1.638 m)   Body mass index is 21.29 kg/m. Ideal Body Weight: Weight in (lb) to have BMI = 25: 147.6  GEN: WDWN, NAD, Non-toxic, A & O x 3, normal weight, looks well  HEENT: Atraumatic, Normocephalic. Neck supple. No masses, No LAD. Ears and Nose: No external deformity. CV: RRR, No M/G/R. No JVD. No thrill. No extra heart  sounds. PULM: CTA B, no wheezes, crackles, rhonchi. No retractions. No resp. distress. No accessory muscle use. EXTR: No c/c/e NEURO Normal gait.  PSYCH: Normally interactive. Conversant. Not depressed or anxious appearing.  Calm demeanor.    Assessment and Plan: Cyst of ovary, unspecified laterality  Abnormal weight loss  Encounter for hepatitis C screening test for low risk patient - Plan: Hepatitis C antibody  Here today to discuss generalized concerns about her health.  Robin Reynolds notes that she still feels very anxious, we are hoping that her therapist and psychiatric care will help her with this. 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 Gave her a flu shot today after some discussion. We will do a hepatitis C screening for today. Her weight loss has been very thoroughly evaluated, and thankfully no sign of serious disease has yet been found. She is actually gained back a few pounds, which is great.  Encouraged her to keep working on weight gain. Asked her to come back and see me in 3 to 6 months, today on her comfort level and have any other doctors visit she has to keep up with  Signed Lamar Blinks, MD  Addendum 1/16.  Received her hepatitis C, negative expected.  Message to patient  Results for orders placed or performed in visit on 11/24/18  Hepatitis C antibody  Result Value Ref Range   Hepatitis C Ab NON-REACTIVE NON-REACTI   SIGNAL TO CUT-OFF 0.01 <1.00

## 2018-11-24 ENCOUNTER — Encounter: Payer: Self-pay | Admitting: Family Medicine

## 2018-11-24 ENCOUNTER — Ambulatory Visit: Payer: BLUE CROSS/BLUE SHIELD | Admitting: Family Medicine

## 2018-11-24 VITALS — BP 116/80 | HR 94 | Temp 98.3°F | Resp 16 | Ht 64.5 in | Wt 126.0 lb

## 2018-11-24 DIAGNOSIS — R634 Abnormal weight loss: Secondary | ICD-10-CM | POA: Diagnosis not present

## 2018-11-24 DIAGNOSIS — N83209 Unspecified ovarian cyst, unspecified side: Secondary | ICD-10-CM | POA: Diagnosis not present

## 2018-11-24 DIAGNOSIS — Z1159 Encounter for screening for other viral diseases: Secondary | ICD-10-CM | POA: Diagnosis not present

## 2018-11-24 NOTE — Patient Instructions (Addendum)
You got your flu shot today We will screen for Hep C today- this should be negative but I will let you know   Please see me in about 3 months to check on how you are doing

## 2018-11-25 ENCOUNTER — Encounter: Payer: Self-pay | Admitting: Family Medicine

## 2018-11-25 LAB — HEPATITIS C ANTIBODY
HEP C AB: NONREACTIVE
SIGNAL TO CUT-OFF: 0.01 (ref <1.00)

## 2018-11-30 ENCOUNTER — Other Ambulatory Visit: Payer: Self-pay | Admitting: Gastroenterology

## 2018-12-10 ENCOUNTER — Telehealth: Payer: Self-pay | Admitting: *Deleted

## 2018-12-10 NOTE — Telephone Encounter (Signed)
Received request for Medical Records from [facility]; forwarded to Medical Records via email/scan/SLS 01/31

## 2018-12-13 ENCOUNTER — Other Ambulatory Visit: Payer: Self-pay | Admitting: Gastroenterology

## 2018-12-15 ENCOUNTER — Encounter (HOSPITAL_COMMUNITY)
Admission: RE | Disposition: A | Discharge status: Home / Self Care | Payer: Self-pay | Source: Ambulatory Visit | Attending: Gastroenterology

## 2018-12-15 ENCOUNTER — Other Ambulatory Visit: Payer: Self-pay

## 2018-12-15 ENCOUNTER — Ambulatory Visit (HOSPITAL_COMMUNITY): Payer: BLUE CROSS/BLUE SHIELD | Admitting: Anesthesiology

## 2018-12-15 ENCOUNTER — Ambulatory Visit (HOSPITAL_COMMUNITY)
Admission: RE | Admit: 2018-12-15 | Discharge: 2018-12-15 | Disposition: A | Discharge status: Home / Self Care | Payer: BLUE CROSS/BLUE SHIELD | Source: Ambulatory Visit | Attending: Gastroenterology | Admitting: Gastroenterology

## 2018-12-15 ENCOUNTER — Encounter (HOSPITAL_COMMUNITY): Payer: Self-pay | Admitting: *Deleted

## 2018-12-15 DIAGNOSIS — Z881 Allergy status to other antibiotic agents status: Secondary | ICD-10-CM | POA: Diagnosis not present

## 2018-12-15 DIAGNOSIS — K3189 Other diseases of stomach and duodenum: Secondary | ICD-10-CM | POA: Insufficient documentation

## 2018-12-15 DIAGNOSIS — Z79899 Other long term (current) drug therapy: Secondary | ICD-10-CM | POA: Diagnosis not present

## 2018-12-15 DIAGNOSIS — F419 Anxiety disorder, unspecified: Secondary | ICD-10-CM | POA: Insufficient documentation

## 2018-12-15 DIAGNOSIS — K319 Disease of stomach and duodenum, unspecified: Secondary | ICD-10-CM | POA: Diagnosis present

## 2018-12-15 DIAGNOSIS — Z79890 Hormone replacement therapy (postmenopausal): Secondary | ICD-10-CM | POA: Diagnosis not present

## 2018-12-15 HISTORY — PX: ESOPHAGOGASTRODUODENOSCOPY: SHX5428

## 2018-12-15 HISTORY — PX: EUS: SHX5427

## 2018-12-15 SURGERY — UPPER ENDOSCOPIC ULTRASOUND (EUS) RADIAL
Anesthesia: Monitor Anesthesia Care

## 2018-12-15 MED ORDER — PROPOFOL 10 MG/ML IV BOLUS
INTRAVENOUS | Status: DC | PRN
  Administered 2018-12-15: 20 mg via INTRAVENOUS
  Administered 2018-12-15: 10 mg via INTRAVENOUS
  Administered 2018-12-15: 20 mg via INTRAVENOUS

## 2018-12-15 MED ORDER — PROPOFOL 500 MG/50ML IV EMUL
INTRAVENOUS | Status: DC | PRN
  Administered 2018-12-15: 125 ug/kg/min via INTRAVENOUS

## 2018-12-15 MED ORDER — PROPOFOL 10 MG/ML IV BOLUS
INTRAVENOUS | Status: AC
  Filled 2018-12-15: qty 60

## 2018-12-15 MED ORDER — SODIUM CHLORIDE 0.9 % IV SOLN: INTRAVENOUS | Status: DC

## 2018-12-15 MED ORDER — LACTATED RINGERS IV SOLN
INTRAVENOUS | Status: DC
  Administered 2018-12-15: 08:00:00 via INTRAVENOUS

## 2018-12-15 NOTE — Anesthesia Postprocedure Evaluation (Signed)
Anesthesia Post Note  Patient: Robin Reynolds  Procedure(s) Performed: UPPER ENDOSCOPIC ULTRASOUND (EUS) RADIAL fiest with possible linear later/possible FNA (N/A )     Patient location during evaluation: PACU Anesthesia Type: MAC Level of consciousness: awake and alert Pain management: pain level controlled Vital Signs Assessment: post-procedure vital signs reviewed and stable Respiratory status: spontaneous breathing, nonlabored ventilation, respiratory function stable and patient connected to nasal cannula oxygen Cardiovascular status: stable and blood pressure returned to baseline Postop Assessment: no apparent nausea or vomiting Anesthetic complications: no    Last Vitals:  Vitals:   12/15/18 0951 12/15/18 1002  BP: 105/69 116/89  Pulse: 86 76  Resp: 18 13  Temp:    SpO2: 100% 99%    Last Pain:  Vitals:   12/15/18 1002  TempSrc:   PainSc: 0-No pain                 Aishwarya Shiplett S

## 2018-12-15 NOTE — Discharge Instructions (Signed)

## 2018-12-15 NOTE — Anesthesia Preprocedure Evaluation (Signed)
Anesthesia Evaluation  Patient identified by MRN, date of birth, ID band Patient awake    Reviewed: Allergy & Precautions, NPO status , Patient's Chart, lab work & pertinent test results  Airway Mallampati: II  TM Distance: >3 FB Neck ROM: Full    Dental no notable dental hx.    Pulmonary neg pulmonary ROS, former smoker,    Pulmonary exam normal breath sounds clear to auscultation       Cardiovascular negative cardio ROS Normal cardiovascular exam Rhythm:Regular Rate:Normal     Neuro/Psych Anxiety negative neurological ROS     GI/Hepatic negative GI ROS, Neg liver ROS,   Endo/Other  negative endocrine ROS  Renal/GU negative Renal ROS  negative genitourinary   Musculoskeletal negative musculoskeletal ROS (+)   Abdominal   Peds negative pediatric ROS (+)  Hematology negative hematology ROS (+)   Anesthesia Other Findings   Reproductive/Obstetrics negative OB ROS                             Anesthesia Physical Anesthesia Plan  ASA: II  Anesthesia Plan: MAC   Post-op Pain Management:    Induction: Intravenous  PONV Risk Score and Plan: 0  Airway Management Planned: Simple Face Mask  Additional Equipment:   Intra-op Plan:   Post-operative Plan:   Informed Consent: I have reviewed the patients History and Physical, chart, labs and discussed the procedure including the risks, benefits and alternatives for the proposed anesthesia with the patient or authorized representative who has indicated his/her understanding and acceptance.     Dental advisory given  Plan Discussed with: CRNA and Surgeon  Anesthesia Plan Comments:         Anesthesia Quick Evaluation

## 2018-12-15 NOTE — Transfer of Care (Signed)
Immediate Anesthesia Transfer of Care Note  Patient: Robin Reynolds  Procedure(s) Performed: UPPER ENDOSCOPIC ULTRASOUND (EUS) RADIAL fiest with possible linear later/possible FNA (N/A )  Patient Location: PACU  Anesthesia Type:MAC  Level of Consciousness: sedated  Airway & Oxygen Therapy: Patient Spontanous Breathing and Patient connected to nasal cannula oxygen  Post-op Assessment: Report given to RN and Post -op Vital signs reviewed and stable  Post vital signs: Reviewed and stable  Last Vitals:  Vitals Value Taken Time  BP    Temp    Pulse 29 12/15/2018  9:50 AM  Resp 18 12/15/2018  9:50 AM  SpO2 100 % 12/15/2018  9:50 AM  Vitals shown include unvalidated device data.  Last Pain:  Vitals:   12/15/18 0800  TempSrc: Oral  PainSc: 0-No pain         Complications: No apparent anesthesia complications

## 2018-12-15 NOTE — Op Note (Signed)
[facility] Patient Name: Anysha Frappier Procedure Date: 12/15/2018 MRN: [id number] Attending MD: Arta Silence , MD Date of Birth: 1956-01-29 CSN: [id number] Age: 63 Admit Type: Outpatient Procedure:                Upper EUS Indications:              Gastric mucosal mass/polyp found on endoscopy Providers:                Arta Silence, MD, Dorise Hiss, RN, Cleda Daub, RN, Elspeth Cho Tech., Technician, Lind Covert CRNA, CRNA Referring MD:             Dr. Ronnette Juniper Medicines:                Monitored Anesthesia Care Complications:            No immediate complications. Estimated Blood Loss:     Estimated blood loss: none. Procedure:                Pre-Anesthesia Assessment:                           - Prior to the procedure, a History and Physical                            was performed, and patient medications and                            allergies were reviewed. The patient's tolerance of                            previous anesthesia was also reviewed. The risks                            and benefits of the procedure and the sedation                            options and risks were discussed with the patient.                            All questions were answered, and informed consent                            was obtained. Prior Anticoagulants: The patient has                            taken no previous anticoagulant or antiplatelet                            agents. ASA Grade Assessment: II - A patient with  mild systemic disease. After reviewing the risks                            and benefits, the patient was deemed in                            satisfactory condition to undergo the procedure.                           After obtaining informed consent, the endoscope was                            passed under direct vision. Throughout the      procedure, the patient's blood pressure, pulse, and                            oxygen saturations were monitored continuously. The                            [id number] ([id number]) Olympus Radial EUS was                            introduced through the mouth, and advanced to the                            duodenal bulb. The upper EUS was accomplished                            without difficulty. The patient tolerated the                            procedure well. Scope In: Scope Out: Findings:      ENDOSONOGRAPHIC FINDING: :      Two intramural (subepithelial) lesion was found in the prepyloric region       of the stomach. The lesions were hypoechoic. Sonographically, the       largest esion appeared to originate from the deep mucosa (Layer 2). The       largest also appeared to involve the following wall layer(s): deep       mucosa (Layer 2). The largest lesion also measured 8 mm by 5 mm in       diameter. The outer endosonographic borders were well defined.      There was no sign of significant endosonographic abnormality in the left       lobe of the liver.      There was no sign of significant endosonographic abnormality involving       the celiac trunk.      There was no sign of significant endosonographic abnormality in the       pancreatic head, genu of the pancreas, pancreatic body and pancreatic       tail. No masses.      No lymphadenopathy seen. Impression:               - An intramural (subepithelial) lesion was found in  the prepyloric region of the stomach. The lesion                            appeared to originate from within the deep mucosa                            (Layer 2).                           - There was no evidence of significant pathology in                            the left lobe of the liver.                           - The celiac trunk was endosonographically normal.                           - There was no sign of  significant pathology in the                            pancreatic head, genu of the pancreas, pancreatic                            body and pancreatic tail.                           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 Region in very distal antrum and                            small size of the lesions not amenable to fine                            needle aspiration biopsies. Moderate Sedation:      None Recommendation:           - Discharge patient to home (via wheelchair).                           - Resume previous diet today.                           - Continue present medications.                           - Return to GI clinic (Dr. Therisa Doyne) as previously                            scheduled.                           -  Given family history GIST lesions, could consider                            repeat EGD/EUS in 2-3 years. Procedure Code(s):        --- Professional ---                           [phone], Esophagogastroduodenoscopy, flexible,                            transoral; with endoscopic ultrasound examination,                            including the esophagus, stomach, and either the                            duodenum or a surgically altered stomach where the                            jejunum is examined distal to the anastomosis Diagnosis Code(s):        --- Professional ---                           K31.89, Other diseases of stomach and duodenum CPT copyright 2018 American Medical Association. All rights reserved. The codes documented in this report are preliminary and upon coder review may  be revised to meet current compliance requirements. Arta Silence, MD 12/15/2018 10:00:59 AM This report has been signed electronically. Number of Addenda: 0

## 2018-12-16 ENCOUNTER — Encounter (HOSPITAL_COMMUNITY): Payer: Self-pay | Admitting: Gastroenterology

## 2019-03-03 ENCOUNTER — Ambulatory Visit: Payer: BLUE CROSS/BLUE SHIELD | Admitting: Family Medicine

## 2020-02-23 ENCOUNTER — Telehealth: Payer: Self-pay

## 2020-02-23 NOTE — Telephone Encounter (Signed)
Spoke with patient, she has not had a tdap/td vaccine in several years if ever. She slammed her hand in a door and was wondering if she is needing a tetantus shot. She states she also had her covid vaccine not even a week ago. Per Dr. Lorelei Pont, patient to wait one week and go get tetanus vaccine at pharmacy.

## 2020-02-23 NOTE — Telephone Encounter (Signed)
Patient called in to see if Dr. Lorelei Pont or the nurse could give her a call to advise her on when she had her last tech shot and the patient also wants to know if she can take her Covid-19 Vaccine along with a Tech shot please follow up with the patient as soon as possible at [phone]

## 2020-03-21 ENCOUNTER — Telehealth: Payer: Self-pay | Admitting: Internal Medicine

## 2020-03-21 NOTE — Telephone Encounter (Signed)
Unclear what the patient is asking but Dr. Cruzita Lederer has not seen this patient seen 08/05/2018.  Please advise.

## 2020-03-21 NOTE — Telephone Encounter (Signed)
Patient called stating she needed a second opinion for diagnosing her with PCOS or if she could refer a patient out for that. Her pcp suggestion having a second opinion with her Endocrinologist. Please advise - [phone]

## 2020-03-21 NOTE — Telephone Encounter (Signed)
M, Usually PCOS, if present, starts to resolve after menopause, so definitely no investigation/treatment is needed at 67. I am not sure if I am maybe missing something... Did she have any abnormal labs? I am not seeingany in the system.

## 2020-04-11 ENCOUNTER — Other Ambulatory Visit: Payer: Self-pay | Admitting: Obstetrics

## 2020-04-11 ENCOUNTER — Encounter (HOSPITAL_BASED_OUTPATIENT_CLINIC_OR_DEPARTMENT_OTHER): Payer: Self-pay | Admitting: Obstetrics

## 2020-04-17 ENCOUNTER — Encounter (HOSPITAL_BASED_OUTPATIENT_CLINIC_OR_DEPARTMENT_OTHER): Payer: Self-pay | Admitting: Obstetrics

## 2020-04-18 ENCOUNTER — Encounter (HOSPITAL_BASED_OUTPATIENT_CLINIC_OR_DEPARTMENT_OTHER): Payer: Self-pay | Admitting: Obstetrics

## 2020-04-18 ENCOUNTER — Other Ambulatory Visit: Payer: Self-pay

## 2020-04-18 NOTE — Progress Notes (Addendum)
ADDENDUM:  Chart reviewed by anesthesia, Konrad Felix PA (which including pt's ENT office note received via fax) and Dr Jenita Seashore MDA, stated pt is appropriate for surgery center. Please refer to [facility] progress note from today 04-19-2020.   Spoke w/ via phone for pre-op interview--- PT Lab needs dos---- no              Lab results------ getting CBC, BMP, EKG done 04-19-2020 @ 1345 COVID test ------ 04-19-2020 @ 1415 Arrive at ------- 1130 NPO after ------ MN w/ exception clear liquids until 0730 then nothing by mouth (no cream/ milk products) Medications to take morning of surgery ----- Xanax, Buspar w/ sips of water Diabetic medication ----- n/a Patient Special Instructions ----- asked to bring cpap/ mask/ tubing dos Pre-Op special Istructions ----- pt having her last ENT office note dated 04-17-2020 faxed , will place on chart when received (Dr Adriana Reams, note not in epic yet) Patient verbalized understanding of instructions that were given at this phone interview. Patient denies shortness of breath, chest pain, fever, cough a this phone interview.   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  Pt stated today she saw her ENT yesterday and was told ok to be intubated for this surgery.  Pt has not had any surgery with general anesthesia, only had MAC with EGD 12-15-2018 refer to Dr Kalman Shan MDA record in epic  Hx MVP/ palpitations (see dr Einar Gip note).  Chart to be reviewed by Konrad Felix PA and pt has PAT appointment for lab / airway check 04-19-2020)  PCP:  Dr Lamar Blinks Cardiologist :  LOV w/ Dr Einar Gip in epic scanned in media dated 03-17-2018 seen for palpitations/ atypical chest pain  / mvp Chest x-ray : Chest CT 09-06-2018 epic EKG :  08-16-2018 epic Echo :  09-10-2017 (in dr Einar Gip note) ETT:  09-11-2017 (in dr Einar Gip note) Cardiac Cath :  no Sleep Study/ CPAP :  YES/  YES Fasting Blood Sugar :      / Checks Blood Sugar  -- times a day:   Pre-diabetic  Blood Thinner/ Instructions /Last Dose: NO ASA / Instructions/ Last Dose :  NO  Patient denies shortness of breath, chest pain, fever, and cough at this phone interview.

## 2020-04-19 ENCOUNTER — Other Ambulatory Visit (HOSPITAL_COMMUNITY)
Admission: RE | Admit: 2020-04-19 | Discharge: 2020-04-19 | Disposition: A | Discharge status: Home / Self Care | Payer: BC Managed Care – PPO | Source: Ambulatory Visit | Attending: Obstetrics | Admitting: Obstetrics

## 2020-04-19 ENCOUNTER — Encounter (HOSPITAL_COMMUNITY)
Admission: RE | Admit: 2020-04-19 | Discharge: 2020-04-19 | Disposition: A | Discharge status: Home / Self Care | Payer: BC Managed Care – PPO | Source: Ambulatory Visit | Attending: Obstetrics | Admitting: Obstetrics

## 2020-04-19 DIAGNOSIS — N83299 Other ovarian cyst, unspecified side: Secondary | ICD-10-CM | POA: Insufficient documentation

## 2020-04-19 DIAGNOSIS — Z87891 Personal history of nicotine dependence: Secondary | ICD-10-CM | POA: Insufficient documentation

## 2020-04-19 DIAGNOSIS — Z01818 Encounter for other preprocedural examination: Secondary | ICD-10-CM | POA: Insufficient documentation

## 2020-04-19 DIAGNOSIS — G4733 Obstructive sleep apnea (adult) (pediatric): Secondary | ICD-10-CM | POA: Insufficient documentation

## 2020-04-19 DIAGNOSIS — F329 Major depressive disorder, single episode, unspecified: Secondary | ICD-10-CM | POA: Insufficient documentation

## 2020-04-19 DIAGNOSIS — E785 Hyperlipidemia, unspecified: Secondary | ICD-10-CM | POA: Diagnosis not present

## 2020-04-19 DIAGNOSIS — Z79899 Other long term (current) drug therapy: Secondary | ICD-10-CM | POA: Insufficient documentation

## 2020-04-19 DIAGNOSIS — R7303 Prediabetes: Secondary | ICD-10-CM | POA: Insufficient documentation

## 2020-04-19 DIAGNOSIS — Z20822 Contact with and (suspected) exposure to covid-19: Secondary | ICD-10-CM | POA: Insufficient documentation

## 2020-04-19 DIAGNOSIS — F411 Generalized anxiety disorder: Secondary | ICD-10-CM | POA: Diagnosis not present

## 2020-04-19 LAB — BASIC METABOLIC PANEL
Anion gap: 11 (ref 5–15)
BUN: 14 mg/dL (ref 8–23)
CO2: 27 mmol/L (ref 22–32)
Calcium: 9.2 mg/dL (ref 8.9–10.3)
Chloride: 103 mmol/L (ref 98–111)
Creatinine, Ser: 0.95 mg/dL (ref 0.44–1.00)
GFR calc Af Amer: >60 mL/min (ref >60)
GFR calc non Af Amer: >60 mL/min (ref >60)
Glucose, Bld: 98 mg/dL (ref 70–99)
Potassium: 4 mmol/L (ref 3.5–5.1)
Sodium: 141 mmol/L (ref 135–145)

## 2020-04-19 LAB — CBC
HCT: 44.4 % (ref 36.0–46.0)
Hemoglobin: 15.1 g/dL — ABNORMAL HIGH (ref 12.0–15.0)
MCH: 32.8 pg (ref 26.0–34.0)
MCHC: 34 g/dL (ref 30.0–36.0)
MCV: 96.3 fL (ref 80.0–100.0)
Platelets: 210 10*3/uL (ref 150–400)
RBC: 4.61 MIL/uL (ref 3.87–5.11)
RDW: 12.2 % (ref 11.5–15.5)
WBC: 6.5 10*3/uL (ref 4.0–10.5)
nRBC: 0 % (ref 0.0–0.2)

## 2020-04-19 LAB — SARS CORONAVIRUS 2 (TAT 6-24 HRS): SARS Coronavirus 2: NEGATIVE

## 2020-04-19 NOTE — Progress Notes (Signed)
Anesthesia Chart Review   Case: [id number] Date/Time: 04/23/20 1315   Procedures:      LAPAROSCOPIC SALPINGO OOPHORECTOMY/With Pelvic Washings (Left )     LAPAROSCOPIC LYSIS OF ADHESIONS (N/A )     Possible XI ROBOTIC ASSISTED SALPINGO OOPHORECTOMY (Left ) - POSSIBLE DAVINCI ASSISTANCE   Anesthesia type: General   Pre-op diagnosis: Complex Ovarian Cyst   Location: [facility] .5 / [place]   Surgeons: Aloha Gell, MD      DISCUSSION:64 y.o. former smoker (quit 6//9/00) with h/o HLD, MVP, mild to moderate MR, OSA on CPAP, pre-diabetes, complex ovarian cyst scheduled for above procedure 04/23/2020 with Dr. Aloha Gell.   Pt told by dentist recently she had a small airway.  She followed up with ENT for evaluation prior to procedure.   Pt seen by ENT 04/17/2020 for pre-operative evaluation.  Per OV note, "Patient has been diagnosed with obstructive sleep apnea and is on CPAP. She is very compliant and feels that she is deriving benefit from the device. Patient has seen Dr. Elzie Rings who did a CT scan which does show narrowing of the oropharyngeal inlet, which would be consistent with her diagnosis of sleep apnea. Patient did have anesthesia about 18 months ago which apparently proceeded smoothly. She is due to have general anesthesia again next week and will likely require intubation.  I recommended that she take her CPAP machine to the hospital with her for her upcoming procedure. If needed it can be applied postoperatively in the recovery room. Patients with sleep apnea are sometimes more difficult to intubate, but anesthesiologists are used to dealing with this and I do not anticipate any particular problems. It is a good sign that she had a procedure done under sedation 18 months ago without problems. I do not recommend any surgical procedure to modify her airway in the future."  It is noted in ENT note that her Mallampati is 3-4.  Per anesthesia note 12/15/2018 Mallampati II, TM  Distance: >3 FB, full neck ROM.    Discussed with Dr. Glennon Mac.  Appropriate for Francis.  Will be prepared DOS.   Anticipate pt can proceed with planned procedure barring acute status change.   VS: There were no vitals taken for this visit.  PROVIDERS: Copland, Gay Filler, MD is PCP    LABS: labs pending (all labs ordered are listed, but only abnormal results are displayed)  Labs Reviewed - No data to display   IMAGES:   EKG:   CV: Echo 09/10/2017:  Left ventricle cavity is normal in size.  LVESD 3.1 cm.  Normal global wall motion.  Normal diastolic filling pattern.  LVEF 60-65%.   Myxomatous degeneration with bileaflet prolapse.  Moderate (Grade II) late systolic primary mitral regurgitation.   Mild tricuspid regurgitation.  Pulmonary artery systolic pressure is estimated at 25 mmHg.   Past Medical History:  Diagnosis Date  . Complex ovarian cyst   . Complication of anesthesia    04-18-2020 per pt was told by her ENT mallampati score 3-4, small airway (tight airway)  . Depression   . Difficult intubation    Has Mallampati score 4 80% closure small airway per patient's dentist as told by pt  . GAD (generalized anxiety disorder)   . Heart murmur    due to MVP,  followed by pcp  . Heart palpitations   . History of Hashimoto thyroiditis 03/2018   endocrinologist--- dr Harriett Rush in epic 08-05-2018    (04-18-2020 per pt  followed by pcp since and thyroid lab work normalized , no longer has hashimoto's)  . History of panic attacks   . Hyperlipidemia   . Mitral valve prolapse    cardiac evaluation by dr Darol Destine in epic 03-17-2018, states had echo and normal ETT 09-10-2017 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 /  ETT 09-11-2017  (04-18-2020 pt states is currently being followed by pcp and has not seen dr Einar Gip or any other cardiologist since)  . OSA on CPAP   . PAC (premature atrial contraction)    per dr Einar Gip note in epic 03-17-2018 pt had 24 hour  holter montior 07-02-2017 that freqent PVCs with bigeminy/ trigeminy and 2 runs atrial tach  . Pre-diabetes   . Systemic fungal infection    04-18-2020  per pt her pcp dx her with this 2019, she takes supplements to improve her immune system  . Uterine fibroid   . Wears glasses     Past Surgical History:  Procedure Laterality Date  . COLONOSCOPY WITH PROPOFOL  10/2018  . ESOPHAGOGASTRODUODENOSCOPY N/A 12/15/2018   Procedure: ESOPHAGOGASTRODUODENOSCOPY (EGD);  Surgeon: Arta Silence, MD;  Location: [facility];  Service: Endoscopy;  Laterality: N/A;  . EUS N/A 12/15/2018   Procedure: UPPER ENDOSCOPIC ULTRASOUND (EUS) RADIAL fiest with possible linear later/possible FNA;  Surgeon: Arta Silence, MD;  Location: [facility];  Service: Endoscopy;  Laterality: N/A;    MEDICATIONS: . ALPRAZolam (XANAX) 0.25 MG tablet  . b complex vitamins tablet  . busPIRone (BUSPAR) 10 MG tablet  . Calcium Citrate-Vitamin D (CALCIUM + D PO)  . Cholecalciferol (DIALYVITE VITAMIN D 5000) 125 MCG (5000 UT) capsule  . conjugated estrogens (PREMARIN) vaginal cream  . CRANBERRY PO  . DULoxetine (CYMBALTA) 20 MG capsule  . Fish Oil-Cholecalciferol (FISH OIL + D3 PO)  . Homeopathic Products (TECNU RASH REL MED ANTI-ITCH EX)  . Milk Thistle-Turmeric (SILYMARIN) CAPS  . OVER THE COUNTER MEDICATION  . OVER THE COUNTER MEDICATION  . OVER THE COUNTER MEDICATION  . Probiotic Product (PROBIOTIC DAILY PO)  . traZODone (DESYREL) 100 MG tablet  . vitamin B-12 (CYANOCOBALAMIN) 500 MCG tablet  . vitamin C (ASCORBIC ACID) 500 MG tablet   No current facility-administered medications for this encounter.     [facility] Pre-Surgical Testing [phone] 04/19/20  10:42 AM

## 2020-04-23 ENCOUNTER — Encounter (HOSPITAL_BASED_OUTPATIENT_CLINIC_OR_DEPARTMENT_OTHER): Payer: Self-pay | Admitting: Obstetrics

## 2020-04-23 ENCOUNTER — Ambulatory Visit (HOSPITAL_BASED_OUTPATIENT_CLINIC_OR_DEPARTMENT_OTHER): Payer: BC Managed Care – PPO | Admitting: Anesthesiology

## 2020-04-23 ENCOUNTER — Encounter (HOSPITAL_BASED_OUTPATIENT_CLINIC_OR_DEPARTMENT_OTHER)
Admission: RE | Disposition: A | Discharge status: Home / Self Care | Payer: Self-pay | Source: Home / Self Care | Attending: Obstetrics

## 2020-04-23 ENCOUNTER — Ambulatory Visit (HOSPITAL_BASED_OUTPATIENT_CLINIC_OR_DEPARTMENT_OTHER)
Admission: RE | Admit: 2020-04-23 | Discharge: 2020-04-23 | Disposition: A | Discharge status: Home / Self Care | Payer: BC Managed Care – PPO | Attending: Obstetrics | Admitting: Obstetrics

## 2020-04-23 DIAGNOSIS — D271 Benign neoplasm of left ovary: Secondary | ICD-10-CM | POA: Diagnosis not present

## 2020-04-23 DIAGNOSIS — Z780 Asymptomatic menopausal state: Secondary | ICD-10-CM | POA: Insufficient documentation

## 2020-04-23 DIAGNOSIS — Z881 Allergy status to other antibiotic agents status: Secondary | ICD-10-CM | POA: Insufficient documentation

## 2020-04-23 DIAGNOSIS — N83202 Unspecified ovarian cyst, left side: Secondary | ICD-10-CM

## 2020-04-23 DIAGNOSIS — G4733 Obstructive sleep apnea (adult) (pediatric): Secondary | ICD-10-CM | POA: Diagnosis not present

## 2020-04-23 DIAGNOSIS — D259 Leiomyoma of uterus, unspecified: Secondary | ICD-10-CM | POA: Diagnosis not present

## 2020-04-23 DIAGNOSIS — I341 Nonrheumatic mitral (valve) prolapse: Secondary | ICD-10-CM | POA: Diagnosis not present

## 2020-04-23 HISTORY — DX: Prediabetes: R73.03

## 2020-04-23 HISTORY — DX: Palpitations: R00.2

## 2020-04-23 HISTORY — DX: Atrial premature depolarization: I49.1

## 2020-04-23 HISTORY — DX: Generalized anxiety disorder: F41.1

## 2020-04-23 HISTORY — DX: Unspecified mycosis: B49

## 2020-04-23 HISTORY — DX: Leiomyoma of uterus, unspecified: D25.9

## 2020-04-23 HISTORY — DX: Presence of spectacles and contact lenses: Z97.3

## 2020-04-23 HISTORY — DX: Other ovarian cyst, unspecified side: N83.299

## 2020-04-23 HISTORY — PX: LAPAROSCOPIC SALPINGO OOPHERECTOMY: SHX5927

## 2020-04-23 HISTORY — DX: Obstructive sleep apnea (adult) (pediatric): G47.33

## 2020-04-23 HISTORY — DX: Personal history of other mental and behavioral disorders: Z86.59

## 2020-04-23 HISTORY — DX: Other specified health status: Z78.9

## 2020-04-23 HISTORY — DX: Cardiac murmur, unspecified: R01.1

## 2020-04-23 SURGERY — SALPINGO-OOPHORECTOMY, LAPAROSCOPIC
Anesthesia: General | Site: Abdomen

## 2020-04-23 MED ORDER — FENTANYL CITRATE (PF) 100 MCG/2ML IJ SOLN
INTRAMUSCULAR | Status: AC
  Filled 2020-04-23: qty 2

## 2020-04-23 MED ORDER — LIDOCAINE HCL (CARDIAC) PF 100 MG/5ML IV SOSY: PREFILLED_SYRINGE | INTRAVENOUS | Status: DC | PRN

## 2020-04-23 MED ORDER — DEXAMETHASONE SODIUM PHOSPHATE 4 MG/ML IJ SOLN
INTRAMUSCULAR | Status: DC | PRN
  Administered 2020-04-23: 5 mg via INTRAVENOUS

## 2020-04-23 MED ORDER — BUPIVACAINE HCL (PF) 0.25 % IJ SOLN
INTRAMUSCULAR | Status: DC | PRN
  Administered 2020-04-23: 10 mL
  Administered 2020-04-23: 20 mL

## 2020-04-23 MED ORDER — LABETALOL HCL 5 MG/ML IV SOLN
INTRAVENOUS | Status: DC | PRN
Start: 2020-04-23 — End: 2020-04-23
  Administered 2020-04-23: 5 mg via INTRAVENOUS

## 2020-04-23 MED ORDER — CEFAZOLIN SODIUM-DEXTROSE 2-4 GM/100ML-% IV SOLN
INTRAVENOUS | Status: AC
  Filled 2020-04-23: qty 100

## 2020-04-23 MED ORDER — EPHEDRINE 5 MG/ML INJ
INTRAVENOUS | Status: AC
  Filled 2020-04-23: qty 10

## 2020-04-23 MED ORDER — PROPOFOL 10 MG/ML IV BOLUS
INTRAVENOUS | Status: DC | PRN
  Administered 2020-04-23: 30 mg via INTRAVENOUS
  Administered 2020-04-23: 130 mg via INTRAVENOUS
  Administered 2020-04-23: 40 mg via INTRAVENOUS

## 2020-04-23 MED ORDER — LACTATED RINGERS IV SOLN: INTRAVENOUS | Status: DC

## 2020-04-23 MED ORDER — LABETALOL HCL 5 MG/ML IV SOLN
INTRAVENOUS | Status: AC
  Filled 2020-04-23: qty 4

## 2020-04-23 MED ORDER — CEFAZOLIN SODIUM-DEXTROSE 2-3 GM-%(50ML) IV SOLR
INTRAVENOUS | Status: DC | PRN
  Administered 2020-04-23: 2 g via INTRAVENOUS

## 2020-04-23 MED ORDER — FENTANYL CITRATE (PF) 100 MCG/2ML IJ SOLN
INTRAMUSCULAR | Status: DC | PRN
  Administered 2020-04-23 (×4): 50 ug via INTRAVENOUS

## 2020-04-23 MED ORDER — SODIUM CHLORIDE 0.9 % IR SOLN
Status: DC | PRN
  Administered 2020-04-23: 3000 mL

## 2020-04-23 MED ORDER — ONDANSETRON HCL 4 MG/2ML IJ SOLN
INTRAMUSCULAR | Status: AC
  Filled 2020-04-23: qty 2

## 2020-04-23 MED ORDER — ROCURONIUM BROMIDE 100 MG/10ML IV SOLN
INTRAVENOUS | Status: DC | PRN
Start: 2020-04-23 — End: 2020-04-23
  Administered 2020-04-23: 20 mg via INTRAVENOUS
  Administered 2020-04-23: 50 mg via INTRAVENOUS

## 2020-04-23 MED ORDER — OXYCODONE HCL 5 MG PO TABS: 5.0000 mg | ORAL_TABLET | Freq: Once | ORAL | Status: DC | PRN

## 2020-04-23 MED ORDER — MIDAZOLAM HCL 5 MG/5ML IJ SOLN
INTRAMUSCULAR | Status: DC | PRN
  Administered 2020-04-23: 2 mg via INTRAVENOUS

## 2020-04-23 MED ORDER — FENTANYL CITRATE (PF) 100 MCG/2ML IJ SOLN: 25.0000 ug | INTRAMUSCULAR | Status: DC | PRN

## 2020-04-23 MED ORDER — MIDAZOLAM HCL 2 MG/2ML IJ SOLN
INTRAMUSCULAR | Status: AC
  Filled 2020-04-23: qty 2

## 2020-04-23 MED ORDER — LIDOCAINE 2% (20 MG/ML) 5 ML SYRINGE
INTRAMUSCULAR | Status: DC | PRN
  Administered 2020-04-23: 80 mg via INTRAVENOUS
  Administered 2020-04-23: 100 mg via INTRAVENOUS

## 2020-04-23 MED ORDER — EPHEDRINE SULFATE-NACL 50-0.9 MG/10ML-% IV SOSY
PREFILLED_SYRINGE | INTRAVENOUS | Status: DC | PRN
  Administered 2020-04-23 (×2): 10 mg via INTRAVENOUS

## 2020-04-23 MED ORDER — PROPOFOL 10 MG/ML IV BOLUS
INTRAVENOUS | Status: AC
  Filled 2020-04-23: qty 20

## 2020-04-23 MED ORDER — LIDOCAINE 2% (20 MG/ML) 5 ML SYRINGE
INTRAMUSCULAR | Status: AC
  Filled 2020-04-23: qty 5

## 2020-04-23 MED ORDER — KETOROLAC TROMETHAMINE 15 MG/ML IJ SOLN
INTRAMUSCULAR | Status: DC | PRN
  Administered 2020-04-23: 15 mg via INTRAVENOUS

## 2020-04-23 MED ORDER — ACETAMINOPHEN 500 MG PO TABS
1000.0000 mg | ORAL_TABLET | Freq: Once | ORAL | Status: AC
  Administered 2020-04-23: 1000 mg via ORAL

## 2020-04-23 MED ORDER — SUGAMMADEX SODIUM 200 MG/2ML IV SOLN
INTRAVENOUS | Status: DC | PRN
Start: 2020-04-23 — End: 2020-04-23
  Administered 2020-04-23: 160 mg via INTRAVENOUS

## 2020-04-23 MED ORDER — ONDANSETRON HCL 4 MG/2ML IJ SOLN
INTRAMUSCULAR | Status: DC | PRN
  Administered 2020-04-23: 4 mg via INTRAVENOUS

## 2020-04-23 MED ORDER — ACETAMINOPHEN 500 MG PO TABS
ORAL_TABLET | ORAL | Status: AC
  Filled 2020-04-23: qty 2

## 2020-04-23 MED ORDER — PROMETHAZINE HCL 25 MG/ML IJ SOLN: 6.2500 mg | INTRAMUSCULAR | Status: DC | PRN

## 2020-04-23 MED ORDER — OXYCODONE-ACETAMINOPHEN 5-325 MG PO TABS: 1.0000 | ORAL_TABLET | ORAL | 0 refills | Status: AC | PRN

## 2020-04-23 MED ORDER — OXYCODONE HCL 5 MG/5ML PO SOLN: 5.0000 mg | Freq: Once | ORAL | Status: DC | PRN

## 2020-04-23 SURGICAL SUPPLY — 60 items
APPLICATOR COTTON TIP 6 STRL (MISCELLANEOUS) ×9 IMPLANT
APPLICATOR COTTON TIP 6IN STRL (MISCELLANEOUS) ×15
COVER BACK TABLE 60X90IN (DRAPES) ×5 IMPLANT
COVER TIP SHEARS 8 DVNC (MISCELLANEOUS) ×3 IMPLANT
COVER TIP SHEARS 8MM DA VINCI (MISCELLANEOUS) ×2
COVER WAND RF STERILE (DRAPES) ×5 IMPLANT
DEFOGGER SCOPE WARMER CLEARIFY (MISCELLANEOUS) ×5 IMPLANT
DERMABOND ADVANCED (GAUZE/BANDAGES/DRESSINGS) ×2
DERMABOND ADVANCED .7 DNX12 (GAUZE/BANDAGES/DRESSINGS) ×3 IMPLANT
DRAPE ARM DVNC X/XI (DISPOSABLE) IMPLANT
DRAPE COLUMN DVNC XI (DISPOSABLE) IMPLANT
DRAPE DA VINCI XI ARM (DISPOSABLE)
DRAPE DA VINCI XI COLUMN (DISPOSABLE)
DRSG OPSITE POSTOP 3X4 (GAUZE/BANDAGES/DRESSINGS) ×5 IMPLANT
DURAPREP 26ML APPLICATOR (WOUND CARE) ×5 IMPLANT
ELECT REM PT RETURN 9FT ADLT (ELECTROSURGICAL) ×5
ELECTRODE REM PT RTRN 9FT ADLT (ELECTROSURGICAL) ×3 IMPLANT
FORCEPS CUTTING 33CM 5MM (CUTTING FORCEPS) ×5 IMPLANT
GLOVE BIO SURGEON STRL SZ 6 (GLOVE) ×5 IMPLANT
GLOVE BIO SURGEON STRL SZ 6.5 (GLOVE) ×12 IMPLANT
GLOVE BIO SURGEONS STRL SZ 6.5 (GLOVE) ×3
GLOVE BIOGEL PI IND STRL 6.5 (GLOVE) ×3 IMPLANT
GLOVE BIOGEL PI IND STRL 7.0 (GLOVE) ×15 IMPLANT
GLOVE BIOGEL PI INDICATOR 6.5 (GLOVE) ×2
GLOVE BIOGEL PI INDICATOR 7.0 (GLOVE) ×10
GOWN STRL REUS W/ TWL LRG LVL3 (GOWN DISPOSABLE) ×6 IMPLANT
GOWN STRL REUS W/TWL LRG LVL3 (GOWN DISPOSABLE) ×4
IRRIG SUCT STRYKERFLOW 2 WTIP (MISCELLANEOUS) ×5
IRRIGATION SUCT STRKRFLW 2 WTP (MISCELLANEOUS) ×3 IMPLANT
IV NS 1000ML (IV SOLUTION) ×2
IV NS 1000ML BAXH (IV SOLUTION) ×3 IMPLANT
KIT TURNOVER CYSTO (KITS) ×5 IMPLANT
LEGGING LITHOTOMY PAIR STRL (DRAPES) ×5 IMPLANT
MANIFOLD NEPTUNE II (INSTRUMENTS) ×5 IMPLANT
MANIPULATOR UTERINE 4.5 ZUMI (MISCELLANEOUS) ×5 IMPLANT
OBTURATOR OPTICAL STANDARD 8MM (TROCAR) ×2
OBTURATOR OPTICAL STND 8 DVNC (TROCAR) ×3
OBTURATOR OPTICALSTD 8 DVNC (TROCAR) ×3 IMPLANT
OCCLUDER COLPOPNEUMO (BALLOONS) ×5 IMPLANT
PACK ROBOT WH (CUSTOM PROCEDURE TRAY) ×5 IMPLANT
PACK ROBOTIC GOWN (GOWN DISPOSABLE) ×5 IMPLANT
PACK TRENDGUARD 450 HYBRID PRO (MISCELLANEOUS) ×3 IMPLANT
PAD PREP 24X48 CUFFED NSTRL (MISCELLANEOUS) ×5 IMPLANT
POUCH LAPAROSCOPIC INSTRUMENT (MISCELLANEOUS) ×5 IMPLANT
SET TRI-LUMEN FLTR TB AIRSEAL (TUBING) ×5 IMPLANT
SUT VIC AB 0 CT1 27 (SUTURE) ×4
SUT VIC AB 0 CT1 27XBRD ANBCTR (SUTURE) ×6 IMPLANT
SUT VIC AB 4-0 PS2 27 (SUTURE) ×10 IMPLANT
SUT VICRYL 0 UR6 27IN ABS (SUTURE) ×5 IMPLANT
SUT VICRYL 4-0 PS2 18IN ABS (SUTURE) ×5 IMPLANT
SUT VLOC 180 0 9IN  GS21 (SUTURE) ×2
SUT VLOC 180 0 9IN GS21 (SUTURE) ×3 IMPLANT
TOWEL OR 17X26 10 PK STRL BLUE (TOWEL DISPOSABLE) ×5 IMPLANT
TRAP SPECIMEN MUCUS 40CC (MISCELLANEOUS) ×5 IMPLANT
TRAY FOLEY W/BAG SLVR 14FR LF (SET/KITS/TRAYS/PACK) ×5 IMPLANT
TRENDGUARD 450 HYBRID PRO PACK (MISCELLANEOUS) ×5
TROCAR BALLN 12MMX100 BLUNT (TROCAR) ×10 IMPLANT
TROCAR BLADELESS OPT 5 100 (ENDOMECHANICALS) ×10 IMPLANT
TROCAR PORT AIRSEAL 8X120 (TROCAR) ×5 IMPLANT
WARMER LAPAROSCOPE (MISCELLANEOUS) ×5 IMPLANT

## 2020-04-23 NOTE — Anesthesia Preprocedure Evaluation (Addendum)
Anesthesia Evaluation  Patient identified by MRN, date of birth, ID band Patient awake    Reviewed: Allergy & Precautions, NPO status , Patient's Chart, lab work & pertinent test results  History of Anesthesia Complications (+) history of anesthetic complications (Patient was told she had a "Mallampati 4 airway and very small glottis" by dentist, no history of previous airway attempts. MP2 on my exam.)  Airway Mallampati: II  TM Distance: >3 FB Neck ROM: Full    Dental no notable dental hx.    Pulmonary sleep apnea and Continuous Positive Airway Pressure Ventilation , former smoker,    Pulmonary exam normal        Cardiovascular negative cardio ROS Normal cardiovascular exam     Neuro/Psych Anxiety Depression negative neurological ROS     GI/Hepatic negative GI ROS, Neg liver ROS,   Endo/Other  negative endocrine ROS  Renal/GU negative Renal ROS  negative genitourinary   Musculoskeletal negative musculoskeletal ROS (+)   Abdominal   Peds  Hematology negative hematology ROS (+)   Anesthesia Other Findings Day of surgery medications reviewed with patient.  Reproductive/Obstetrics Complex Ovarian Cyst                            Anesthesia Physical Anesthesia Plan  ASA: II  Anesthesia Plan: General   Post-op Pain Management:    Induction: Intravenous  PONV Risk Score and Plan: 4 or greater and Ondansetron, Dexamethasone, Midazolam and Treatment may vary due to age or medical condition  Airway Management Planned: Oral ETT  Additional Equipment: None  Intra-op Plan:   Post-operative Plan: Extubation in OR  Informed Consent: I have reviewed the patients History and Physical, chart, labs and discussed the procedure including the risks, benefits and alternatives for the proposed anesthesia with the patient or authorized representative who has indicated his/her understanding and  acceptance.     Dental advisory given  Plan Discussed with: CRNA  Anesthesia Plan Comments:        Anesthesia Quick Evaluation

## 2020-04-23 NOTE — Op Note (Signed)
04/23/2020  3:02 PM  PATIENT:  Robin Reynolds  64 y.o. female  PRE-OPERATIVE DIAGNOSIS:  Complex Ovarian Cyst  POST-OPERATIVE DIAGNOSIS:  Complex Ovarian Cyst, L ovarian dermoid  PROCEDURE:  Procedure(s): LAPAROSCOPIC SALPINGO OOPHORECTOMY/With Pelvic Washings, REMOVAL OF DERMOID (Left)  SURGEON:  Surgeon(s) and Role:    Robin Gell, MD - Primary    * Almquist, Earlyne Iba, MD - Assisting  PHYSICIAN ASSISTANT: none  ASSISTANTS: As above  ANESTHESIA:   local and general  EBL:  25 mL   BLOOD ADMINISTERED:none  DRAINS: Urinary Catheter (Foley) and Which was removed at the end of the case   LOCAL MEDICATIONS USED:  MARCAINE    and BUPIVICAINE   SPECIMEN:  Source of Specimen:  Pelvic washings, left tube and ovary  DISPOSITION OF SPECIMEN:  PATHOLOGY  Findings: Left ovarian cyst, normal left tube, normal color stall sitting left ureter deep in the pelvis, no significant adhesions, normal atrophic right ovary, normal right tube, posterior fundal 6 cm fibroid.  COUNTS:  YES  TOURNIQUET:  * No tourniquets in log *  DICTATION: .Note written in [place]: Discharge to home after PACU  PATIENT DISPOSITION:  PACU - hemodynamically stable.   Delay start of Pharmacological VTE agent (>24hrs) due to surgical blood loss or risk of bleeding: yes   Indications: This is a 64 year old menopausal patient with persistent left complex ovarian cyst to desired surgical removal for definitive diagnosis.  Patient is not having any left-sided pain but occasional right lower quadrant pain.  Patient aware removal of the left mass is unlikely to affect her intermittent right lower quadrant pain.  Patient also has known fibroids and declined hysterectomy.  Procedure: After informed consent including discussion of risks of bleeding, infection, failure to achieve desired outcome and discussion of alternatives, specifically in her case expectant management, the patient was taken to the  operating room where general anesthesia was initiated without difficulty. She was prepped and draped in normal sterile fashion in the dorsal supine lithotomy position. A Foley catheter was inserted sterilely into the bladder. A bimanual examination was done to assess the size and position of the uterus. The speculum was inserted into the vagina. A single-tooth tenaculum was used to grasp the anterior lip of the cervix. The cervix was dilated to a #17 Pratt  dilator. A ZUMI uterine manipulator was easily inserted into the uterus and the intrauterine balloon was inflated.  Attention was then turned to the patient's abdomen. 0.5 % marcaine was used prior to all incision. A total of 20 cc of marcaine was used.  A 10 mm incision was made in the umbilicus and blunt and sharp dissection was done until the fascia was identified. This was then grasped with Kocher clamps x2 and entered sharply. A pursestring suture of 0 Vicryl was then placed along the incision and a non-bladed Robin Reynolds was inserted into the peritoneal cavity. Intraperitoneal placement was confirmed with the use of the camera and pneumoperitoneum was created to 15 mm of mercury. The pursestring suture was secured around the port and pneumoperitoneum was maintained. Brief survey of the abdomen and pelvis was done with findings as above.  The decision was made to proceed with straight stick laparoscopy as no significant adhesions were noted.  The abdominal wall was assessed and additional port sites were marked.  5 mm incisions were placed in the right and left lower quadrants. Ports were placed under direct visualization.   Pelvic washings were obtained.  Bilateral  tubes and ovaries were assessed with findings as above the uterus with its fibroid was assessed and found to be free of adhesions.  The left ureter was identified seen deep in the left pelvis.  The ZUMI manipulator was used to stretch the uterus to the pelvis is right side, the left tube and ovary  were manipulated to identify the infundibulopelvic ligament.  This was serially cauterized with the gyrus bipolar device.  The dissection was carried through the broad ligament until we reached the utero-ovarian ligament.  The proximal tube was serially cauterized and transected.  The left utero-ovarian ligament was serially cauterized and transected.  A small amount of dissection was left in the broad ligament and the tube and ovary were then free.  The camera was placed in the left lower quadrant port the Endo Catch bag was placed through the umbilical incision.  The left tube and ovary were placed in the Endo Catch bag this is pulled up through the umbilical incision.  At the umbilicus the bag was secured and opened and the cyst was opened and drained of its fatty fluid.  Some dissection was needed to debulk the cyst until the bag was able to be pulled through the fascial incision.  The left tube and ovary were sent to pathology.  Cath was placed back into the umbilical port pneumoperitoneum was recreated.  Survey of the abdomen pelvis revealed hemostasis of the left IP and left utero-ovarian ligament.  Free fluid was removed from the pelvis and 20 cc of bupivacaine was placed over the dissected sites.  Pneumoperitoneum was released, all ports and instruments were removed.  The right left lower quadrant incisions were closed with subcuticular 4-0 Vicryl.  The fascial incision at the umbilicus had its pursestring 0 Vicryl suture closed and the skin was closed with a 4-0 Vicryl.  Dermabond was placed.  I then went down to the vagina repeat speculum exam after removing the ZUMI uterine manipulator revealed hemostatic cervical laceration.  Procedure was terminated.  Sponge lap and needle counts were correct x3 patient was taken recovery room in stable condition  Robin Reynolds 04/23/2020 3:14 PM

## 2020-04-23 NOTE — Discharge Instructions (Signed)
DISCHARGE INSTRUCTIONS: Laparoscopy  The following instructions have been prepared to help you care for yourself upon your return home today.  Wound care: Marland Kitchen Do not get the incision wet for the first 24 hours. The incision should be kept clean and dry. . The Band-Aids or dressings may be removed the day after surgery. . Should the incision become sore, red, and swollen after the first week, check with your doctor.  Personal hygiene: . Shower the day after your procedure.  Activity and limitations: . Do NOT drive or operate any equipment today. . Do NOT lift anything more than 15 pounds for 2-3 weeks after surgery. . Do NOT rest in bed all day. . Walking is encouraged. Walk each day, starting slowly with 5-minute walks 3 or 4 times a day. Slowly increase the length of your walks. . Walk up and down stairs slowly. . Do NOT do strenuous activities, such as golfing, playing tennis, bowling, running, biking, weight lifting, gardening, mowing, or vacuuming for 2-4 weeks. Ask your doctor when it is okay to start.  Diet: Eat a light meal as desired this evening. You may resume your usual diet tomorrow.  Return to work: This is dependent on the type of work you do. For the most part you can return to a desk job within a week of surgery. If you are more active at work, please discuss this with your doctor.  What to expect after your surgery: You may have a slight burning sensation when you urinate on the first day. You may have a very small amount of blood in the urine. Expect to have a small amount of vaginal discharge/light bleeding for 1-2 weeks. It is not unusual to have abdominal soreness and bruising for up to 2 weeks. You may be tired and need more rest for about 1 week. You may experience shoulder pain for 24-72 hours. Lying flat in bed may relieve it.  Call your doctor for any of the following: . Develop a fever of 100.4 or greater . Inability to urinate 6 hours after discharge from  hospital . Severe pain not relieved by pain medications . Persistent of heavy bleeding at incision site . Redness or swelling around incision site after a week . Increasing nausea or vomiting   Post Anesthesia Home Care Instructions  Activity: Get plenty of rest for the remainder of the day. A responsible individual must stay with you for 24 hours following the procedure.  For the next 24 hours, DO NOT: -Drive a car -Paediatric nurse -Drink alcoholic beverages -Take any medication unless instructed by your physician -Make any legal decisions or sign important papers.  Meals: Start with liquid foods such as gelatin or soup. Progress to regular foods as tolerated. Avoid greasy, spicy, heavy foods. If nausea and/or vomiting occur, drink only clear liquids until the nausea and/or vomiting subsides. Call your physician if vomiting continues.  Special Instructions/Symptoms: Your throat may feel dry or sore from the anesthesia or the breathing tube placed in your throat during surgery. If this causes discomfort, gargle with warm salt water. The discomfort should disappear within 24 hours.  If you had a scopolamine patch placed behind your ear for the management of post- operative nausea and/or vomiting:  1. The medication in the patch is effective for 72 hours, after which it should be removed.  Wrap patch in a tissue and discard in the trash. Wash hands thoroughly with soap and water. 2. You may remove the patch earlier than 72  hours if you experience unpleasant side effects which may include dry mouth, dizziness or visual disturbances. 3. Avoid touching the patch. Wash your hands with soap and water after contact with the patch.     Post Anesthesia Home Care Instructions  Activity: Get plenty of rest for the remainder of the day. A responsible individual must stay with you for 24 hours following the procedure.  For the next 24 hours, DO NOT: -Drive a car -Paediatric nurse -Drink  alcoholic beverages -Take any medication unless instructed by your physician -Make any legal decisions or sign important papers.  Meals: Start with liquid foods such as gelatin or soup. Progress to regular foods as tolerated. Avoid greasy, spicy, heavy foods. If nausea and/or vomiting occur, drink only clear liquids until the nausea and/or vomiting subsides. Call your physician if vomiting continues.  Special Instructions/Symptoms: Your throat may feel dry or sore from the anesthesia or the breathing tube placed in your throat during surgery. If this causes discomfort, gargle with warm salt water. The discomfort should disappear within 24 hours.

## 2020-04-23 NOTE — Anesthesia Procedure Notes (Signed)
Procedure Name: Intubation Date/Time: 04/23/2020 1:34 PM Performed by: Eulas Post, Travis Purk W, CRNA Pre-anesthesia Checklist: Patient identified, Emergency Drugs available, Suction available and Patient being monitored Patient Re-evaluated:Patient Re-evaluated prior to induction Oxygen Delivery Method: Circle system utilized Preoxygenation: Pre-oxygenation with 100% oxygen Induction Type: IV induction Ventilation: Mask ventilation without difficulty Laryngoscope Size: Miller and 2 Grade View: Grade I Tube type: Oral Tube size: 6.5 mm Number of attempts: 1 Airway Equipment and Method: Stylet Placement Confirmation: ETT inserted through vocal cords under direct vision,  positive ETCO2 and breath sounds checked- equal and bilateral Secured at: 21 cm Tube secured with: Tape Dental Injury: Teeth and Oropharynx as per pre-operative assessment

## 2020-04-23 NOTE — Transfer of Care (Addendum)
Immediate Anesthesia Transfer of Care Note  Patient: Robin Reynolds  Procedure(s) Performed: LAPAROSCOPIC SALPINGO OOPHORECTOMY/With Pelvic Washings, REMOVAL OF DERMOID (Left Abdomen)  Patient Location: PACU  Anesthesia Type:General  Level of Consciousness: drowsy  Airway & Oxygen Therapy: Patient Spontanous Breathing and Patient connected to nasal cannula oxygen  Post-op Assessment: Report given to RN  Post vital signs: Reviewed and stable  Last Vitals: 144/95 Vitals Value Taken Time  BP    Temp    Pulse 85 04/23/20 1513  Resp 16 04/23/20 1513  SpO2 99 % 04/23/20 1513  Vitals shown include unvalidated device data.  Last Pain:  Vitals:   04/23/20 1208  TempSrc: Oral  PainSc: 0-No pain      Patients Stated Pain Goal: 4 (50/03/70 4888)  Complications: small laceration on upper lip

## 2020-04-23 NOTE — Brief Op Note (Signed)
04/23/2020  3:02 PM  PATIENT:  Shelda Jakes  64 y.o. female  PRE-OPERATIVE DIAGNOSIS:  Complex Ovarian Cyst  POST-OPERATIVE DIAGNOSIS:  Complex Ovarian Cyst, L ovarian dermoid  PROCEDURE:  Procedure(s): LAPAROSCOPIC SALPINGO OOPHORECTOMY/With Pelvic Washings, REMOVAL OF DERMOID (Left)  SURGEON:  Surgeon(s) and Role:    * Aloha Gell, MD - Primary    * Almquist, Earlyne Iba, MD - Assisting  PHYSICIAN ASSISTANT: none  ASSISTANTS: As above  ANESTHESIA:   local and general  EBL:  25 mL   BLOOD ADMINISTERED:none  DRAINS: Urinary Catheter (Foley) and Which was removed at the end of the case   LOCAL MEDICATIONS USED:  MARCAINE    and BUPIVICAINE   SPECIMEN:  Source of Specimen:  Pelvic washings, left tube and ovary  DISPOSITION OF SPECIMEN:  PATHOLOGY  COUNTS:  YES  TOURNIQUET:  * No tourniquets in log *  DICTATION: .Note written in EPIC  PLAN OF CARE: Discharge to home after PACU  PATIENT DISPOSITION:  PACU - hemodynamically stable.   Delay start of Pharmacological VTE agent (>24hrs) due to surgical blood loss or risk of bleeding: yes

## 2020-04-23 NOTE — H&P (Signed)
Chief complaint: Complex left ovarian cyst  History of present illness: 64 year old menopausal woman who has been followed over the past few years for complex left ovarian mass.  This mass appears to have both cystic and solid areas with no internal blood flow.  CT scan back in 2019 was most consistent with a dermoid.  Ca1 04 November 2019 was normal at 9.  While patient is not having significant symptoms from this mass patient prefers surgical removal for a definitive diagnosis.  Patient also with intermittent right lower quadrant pain and known fibroids.  Patient has been offered the option of hysterectomy but prefers a simpler left salpingo-oophorectomy.  Patient notes current dental infection for which her dentist is planning to start her on antibiotics tomorrow..  Past Medical History:  Diagnosis Date   Complex ovarian cyst    Complication of anesthesia    04-18-2020 per pt was told by her ENT mallampati score 3-4, small airway (tight airway)   Depression    Difficult intubation    Has Mallampati score 4 80% closure small airway per patient's dentist as told by pt   GAD (generalized anxiety disorder)    Heart murmur    due to MVP,  followed by pcp   Heart palpitations    History of Hashimoto thyroiditis 03/2018   endocrinologist--- dr Cruzita Lederer,  Cassell Clement in epic 08-05-2018    (04-18-2020 per pt followed by pcp since and thyroid lab work normalized , no longer has hashimoto's)   History of panic attacks    Hyperlipidemia    Mitral valve prolapse    cardiac evaluation by dr Darol Destine in epic 03-17-2018, states had echo and normal ETT 09-10-2017 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 /  ETT 09-11-2017  (04-18-2020 pt states is currently being followed by pcp and has not seen dr Einar Gip or any other cardiologist since)   OSA on CPAP    PAC (premature atrial contraction)    per dr Einar Gip note in epic 03-17-2018 pt had 24 hour holter montior 07-02-2017 that  freqent PVCs with bigeminy/ trigeminy and 2 runs atrial tach   Pre-diabetes    Systemic fungal infection    04-18-2020  per pt her pcp dx her with this 2019, she takes supplements to improve her immune system   Uterine fibroid    Wears glasses     Past Surgical History:  Procedure Laterality Date   COLONOSCOPY WITH PROPOFOL  10/2018   ESOPHAGOGASTRODUODENOSCOPY N/A 12/15/2018   Procedure: ESOPHAGOGASTRODUODENOSCOPY (EGD);  Surgeon: Arta Silence, MD;  Location: [facility];  Service: Endoscopy;  Laterality: N/A;   EUS N/A 12/15/2018   Procedure: UPPER ENDOSCOPIC ULTRASOUND (EUS) RADIAL fiest with possible linear later/possible FNA;  Surgeon: Arta Silence, MD;  Location: [facility];  Service: Endoscopy;  Laterality: N/A;    All: Cipro  PE:  Vitals:   04/23/20 1208  BP: (!) 156/96  Pulse: (!) 54  Resp: 15  Temp: 97.7 F (36.5 C)  SpO2: 99%   Gen: well appearing, no distress Abd: soft, NT GU: Deferred to the OR Lower extremity: Nontender, no edema  CBC    Component Value Date/Time   WBC 6.5 04/19/2020 1348   RBC 4.61 04/19/2020 1348   HGB 15.1 (H) 04/19/2020 1348   HCT 44.4 04/19/2020 1348   PLT 210 04/19/2020 1348   MCV 96.3 04/19/2020 1348   MCH 32.8 04/19/2020 1348   MCHC 34.0 04/19/2020 1348   RDW 12.2 04/19/2020 1348  Assessment and plan: 64 year old with complex left ovarian cyst for left salpingo-oophorectomy.  Risk benefits discussed with patient who agrees to proceed.  We will have robotic assistance if needed though will start with straight stick laparoscopy  Current dental infection.  Will give 2 g of Ancef  Dispo: Expect discharge home later today postop.  [name] 04/23/2020 1:27 PM

## 2020-04-24 ENCOUNTER — Encounter (HOSPITAL_BASED_OUTPATIENT_CLINIC_OR_DEPARTMENT_OTHER): Payer: Self-pay | Admitting: Obstetrics

## 2020-04-24 LAB — SURGICAL PATHOLOGY

## 2020-04-24 LAB — CYTOLOGY - NON PAP

## 2020-04-24 NOTE — Anesthesia Postprocedure Evaluation (Addendum)
Anesthesia Post Note  Patient: Robin Reynolds  Procedure(s) Performed: LAPAROSCOPIC SALPINGO OOPHORECTOMY/With Pelvic Washings, REMOVAL OF DERMOID (Left Abdomen)     Patient location during evaluation: PACU Anesthesia Type: General Level of consciousness: awake and alert Pain management: pain level controlled Vital Signs Assessment: post-procedure vital signs reviewed and stable Respiratory status: spontaneous breathing, nonlabored ventilation, respiratory function stable and patient connected to nasal cannula oxygen Cardiovascular status: blood pressure returned to baseline and stable Postop Assessment: no apparent nausea or vomiting Anesthetic complications: no   No complications documented.  Last Vitals:  Vitals:   04/23/20 1545 04/23/20 1630  BP: (!) 142/92 (!) 142/83  Pulse: 83 68  Resp: 17 16  Temp:  36.5 C  SpO2: 100% 95%    Last Pain:  Vitals:   04/23/20 1630  TempSrc:   PainSc: 0-No pain                 Nizhoni Parlow DAVID

## 2020-10-11 IMAGING — CT CT CHEST W/ CM
2 of 5 series · 12 of 36 positions shown, 15 images · IV contrast (APPLIED)
Comparison: None.

CLINICAL DATA: Abnormal weight loss

EXAM:
CT CHEST, ABDOMEN, AND PELVIS WITH CONTRAST
TECHNIQUE: Multidetector CT imaging of the chest, abdomen and pelvis was
performed following the standard protocol during bolus
administration of intravenous contrast.
CONTRAST:  100mL [id number] IOPAMIDOL ([id number]) INJECTION 61%

[Series 2: cap with 2 · axial · 0.80mm/px · z∈[-607,-92]mm · 9 of 129 slices shown, 12 images]
[im 13/129  mediastinal]
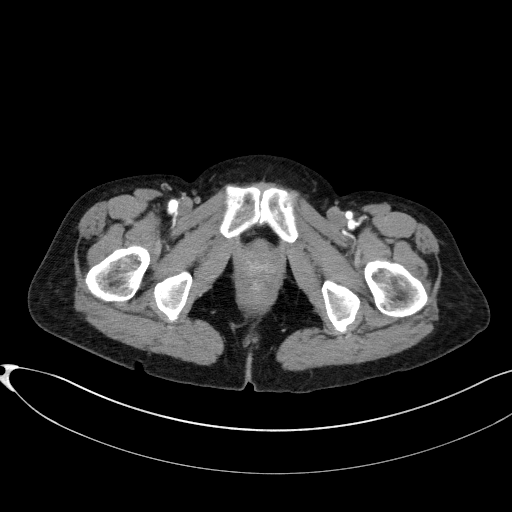
[im 13/129  lung]
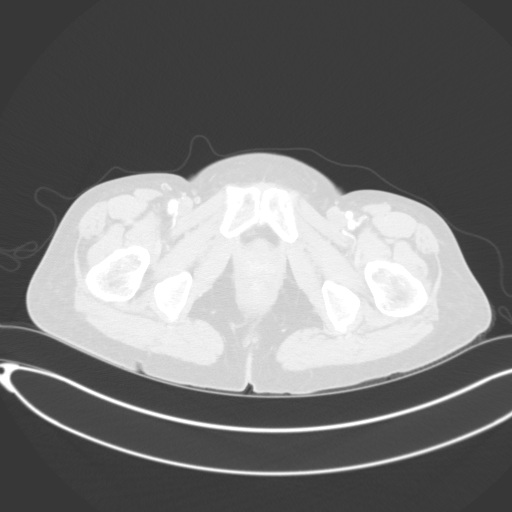
[im 26/129  lung]
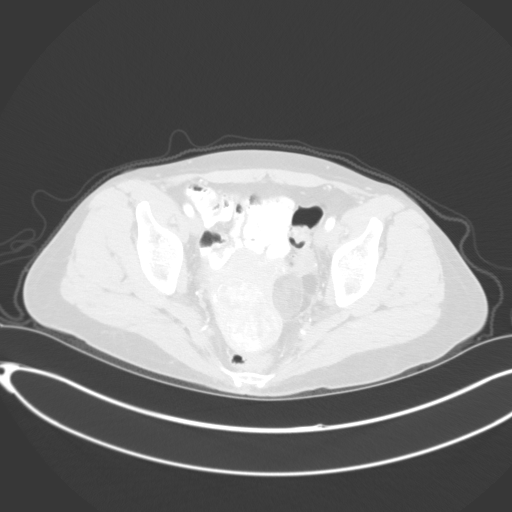
[im 39/129  lung]
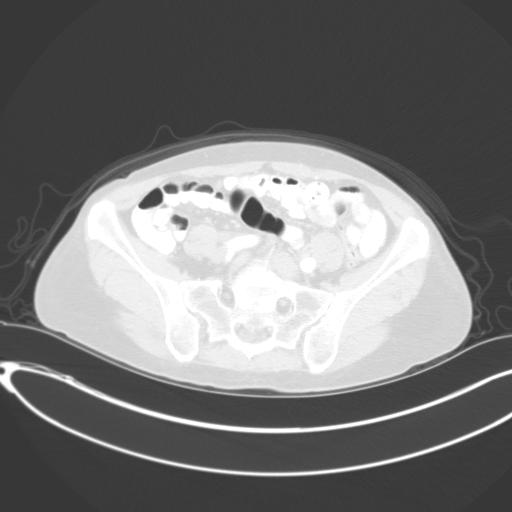
[im 52/129  lung]
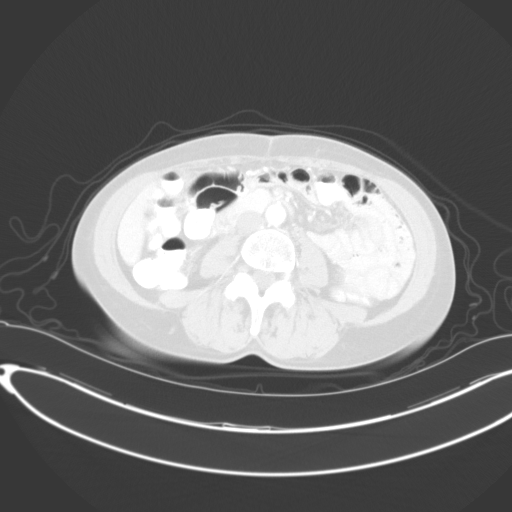
[im 65/129  mediastinal]
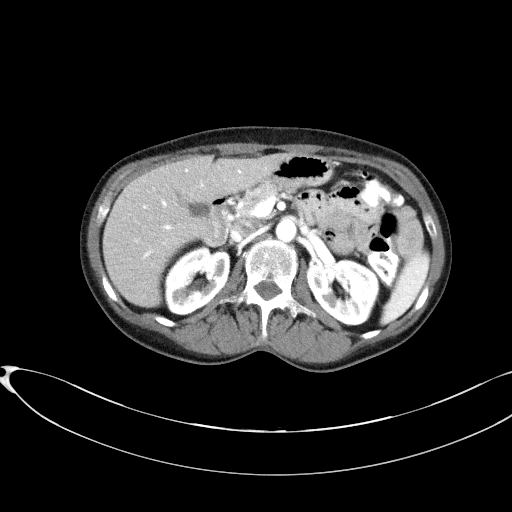
[im 65/129  lung]
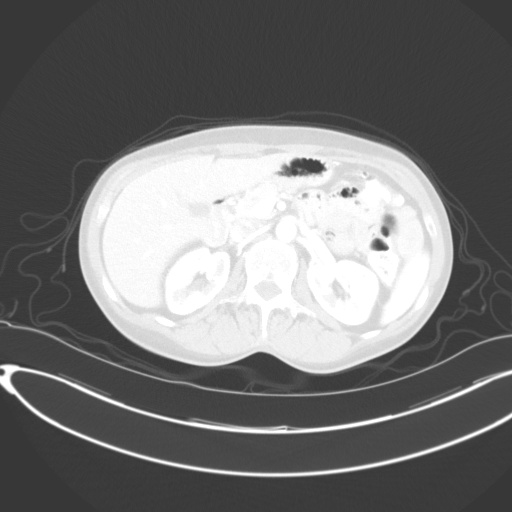
[im 77/129  lung]
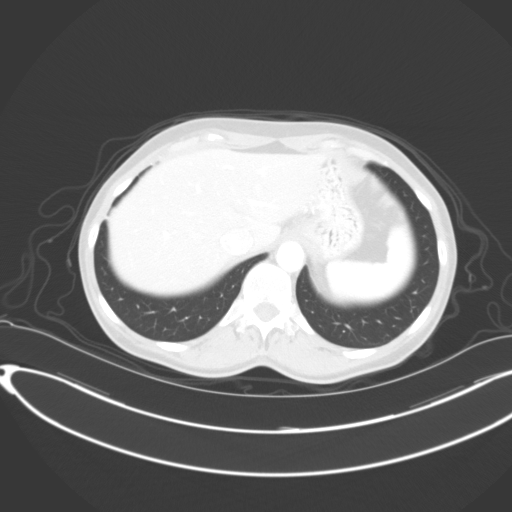
[im 90/129  lung]
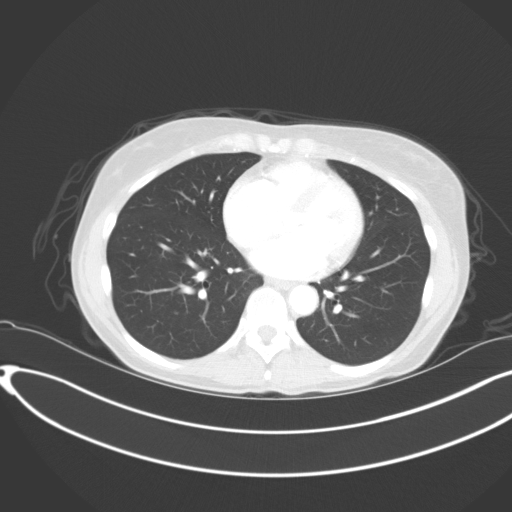
[im 103/129  lung]
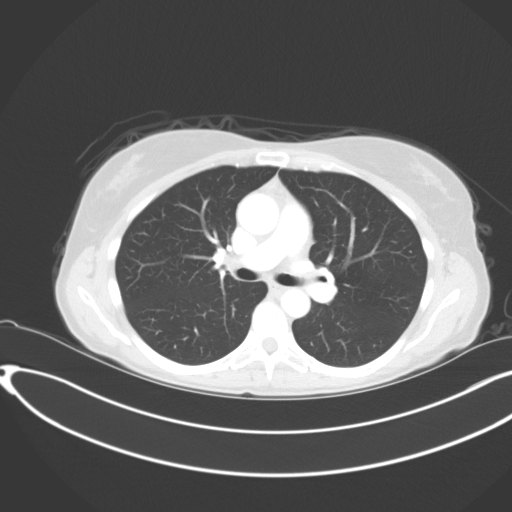
[im 116/129  mediastinal]
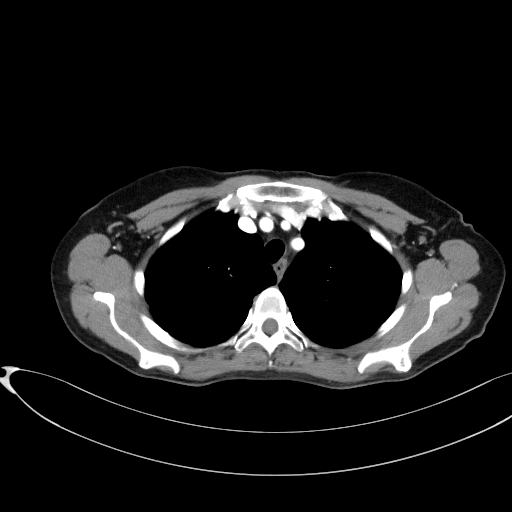
[im 116/129  lung]
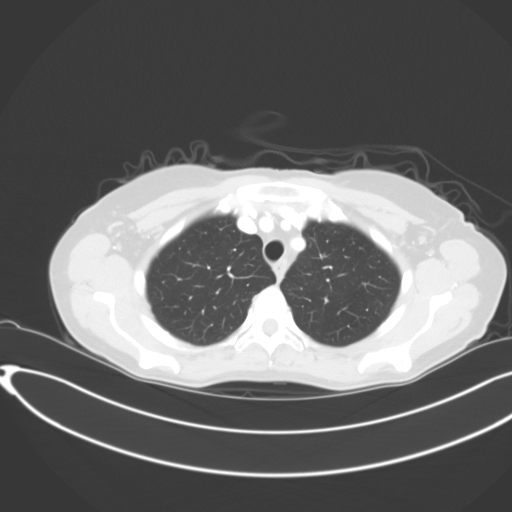

[Series 5: coronals · coronal · 0.67mm/px · 3 of 112 slices shown]
[im 23/112  lung]
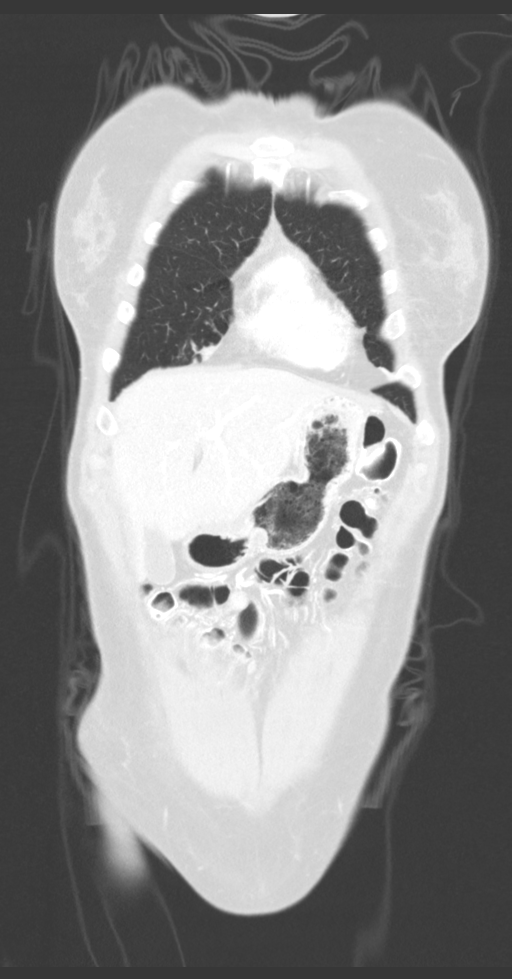
[im 45/112  lung]
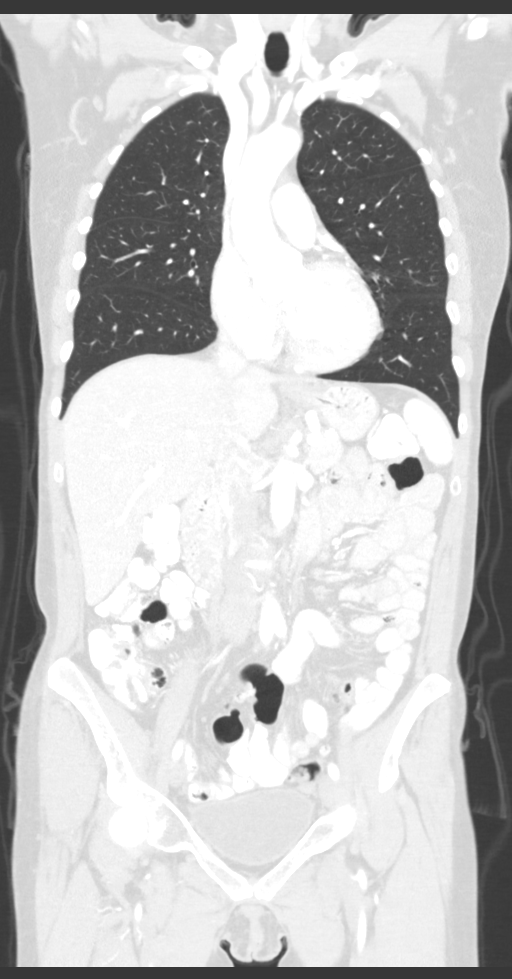
[im 67/112  lung]
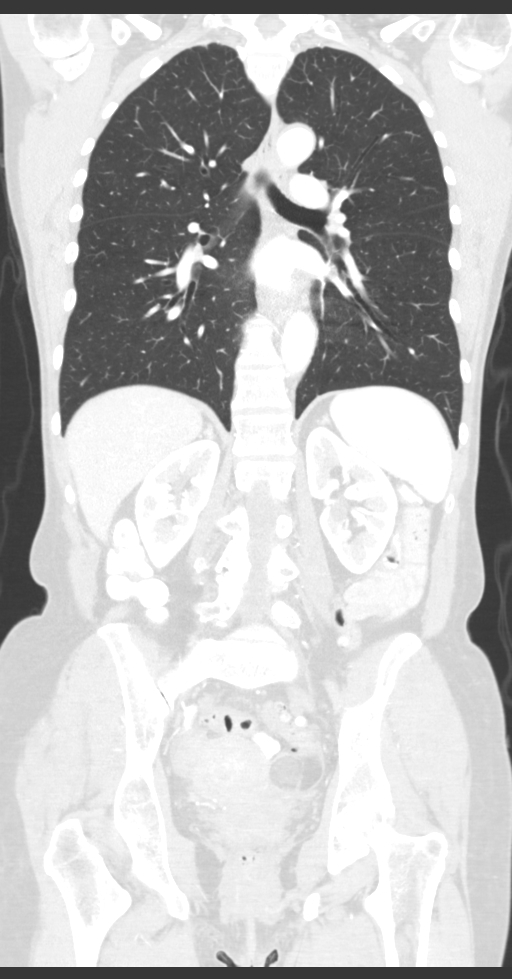

[12 of 36 positions shown; findings below may reference images not displayed]

FINDINGS: CT CHEST FINDINGS

Cardiovascular: Mild atherosclerotic calcification. Normal heart
size. No pericardial effusion.

Mediastinum/Nodes: Negative for adenopathy or mass

Lungs/Pleura: Mild scarring in the medial segment right middle lobe
where there are impacted distal airways. No suspicious nodule or
mass.

Musculoskeletal: No acute or aggressive finding

CT ABDOMEN PELVIS FINDINGS

Hepatobiliary: No focal liver abnormality.No evidence of biliary
obstruction or stone.

Pancreas: Unremarkable.

Spleen: Unremarkable.

Adrenals/Urinary Tract: Negative adrenals. No hydronephrosis or
stone. Subcentimeter low densities in the bilateral renal cortex,
too small for densitometry. Unremarkable bladder.

Stomach/Bowel:  No obstruction. No appendicitis.

Vascular/Lymphatic: No acute vascular abnormality. Minor
atherosclerotic calcification. No mass or adenopathy.

Reproductive:6.2 cm enhancing mass within the uterus. There is a
fatty mass within the left adnexa measuring 4.3 cm.

Other: No ascites or pneumoperitoneum.

Musculoskeletal: No acute abnormalities. L4-5 grade 1
anterolisthesis associated with facet arthropathy. No aggressive
finding.
IMPRESSION: 1. No explanation for weight loss. No evidence of malignancy,
inflammation, or infection.
2. 6 cm fibroid and 4.3 cm left ovarian dermoid.
3. Mild atherosclerosis.
4. Diverticulosis.
